# Patient Record
Sex: Male | Born: 1975 | Race: White | Hispanic: No | Smoking: Never smoker
Health system: Southern US, Community
[De-identification: ages and names within clinical notes are randomized; demographics above are authoritative.]

## PROBLEM LIST (undated history)

## (undated) DIAGNOSIS — E559 Vitamin D deficiency, unspecified: Secondary | ICD-10-CM

## (undated) DIAGNOSIS — Z973 Presence of spectacles and contact lenses: Secondary | ICD-10-CM

## (undated) HISTORY — DX: Vitamin D deficiency, unspecified: E55.9

## (undated) HISTORY — DX: Presence of spectacles and contact lenses: Z97.3

## (undated) HISTORY — PX: WISDOM TOOTH EXTRACTION: SHX21

---

## 2011-09-02 ENCOUNTER — Encounter (HOSPITAL_COMMUNITY): Payer: Self-pay | Admitting: *Deleted

## 2011-09-02 ENCOUNTER — Emergency Department (HOSPITAL_COMMUNITY)
Admission: EM | Admit: 2011-09-02 | Discharge: 2011-09-03 | Disposition: A | Discharge status: Home / Self Care | Payer: BC Managed Care – PPO | Attending: Emergency Medicine | Admitting: Emergency Medicine

## 2011-09-02 ENCOUNTER — Emergency Department (HOSPITAL_COMMUNITY): Payer: BC Managed Care – PPO

## 2011-09-02 DIAGNOSIS — K5900 Constipation, unspecified: Secondary | ICD-10-CM | POA: Insufficient documentation

## 2011-09-02 DIAGNOSIS — R10817 Generalized abdominal tenderness: Secondary | ICD-10-CM | POA: Insufficient documentation

## 2011-09-02 DIAGNOSIS — R109 Unspecified abdominal pain: Secondary | ICD-10-CM | POA: Insufficient documentation

## 2011-09-02 DIAGNOSIS — R110 Nausea: Secondary | ICD-10-CM | POA: Insufficient documentation

## 2011-09-02 DIAGNOSIS — R231 Pallor: Secondary | ICD-10-CM | POA: Insufficient documentation

## 2011-09-02 LAB — COMPREHENSIVE METABOLIC PANEL
ALT: 35 U/L (ref 0–53)
AST: 39 U/L — ABNORMAL HIGH (ref 0–37)
Alkaline Phosphatase: 121 U/L — ABNORMAL HIGH (ref 39–117)
CO2: 26 mEq/L (ref 19–32)
Chloride: 96 mEq/L (ref 96–112)
GFR calc non Af Amer: 87 mL/min — ABNORMAL LOW (ref >90)
Potassium: 3.7 mEq/L (ref 3.5–5.1)
Sodium: 134 mEq/L — ABNORMAL LOW (ref 135–145)
Total Bilirubin: 0.4 mg/dL (ref 0.3–1.2)

## 2011-09-02 LAB — URINALYSIS, ROUTINE W REFLEX MICROSCOPIC
Glucose, UA: NEGATIVE mg/dL
Hgb urine dipstick: NEGATIVE
Ketones, ur: NEGATIVE mg/dL
Protein, ur: NEGATIVE mg/dL

## 2011-09-02 LAB — CBC WITH DIFFERENTIAL/PLATELET
Basophils Absolute: 0 10*3/uL (ref 0.0–0.1)
Lymphocytes Relative: 28 % (ref 12–46)
Neutro Abs: 3.5 10*3/uL (ref 1.7–7.7)
Platelets: 188 10*3/uL (ref 150–400)
RBC: 4.78 MIL/uL (ref 4.22–5.81)
RDW: 11.9 % (ref 11.5–15.5)
WBC: 5.9 10*3/uL (ref 4.0–10.5)

## 2011-09-02 MED ORDER — PANTOPRAZOLE SODIUM 40 MG IV SOLR
40.0000 mg | Freq: Once | INTRAVENOUS | Status: AC
  Administered 2011-09-02: 40 mg via INTRAVENOUS
  Filled 2011-09-02: qty 40

## 2011-09-02 MED ORDER — ONDANSETRON HCL 4 MG/2ML IJ SOLN
4.0000 mg | Freq: Once | INTRAMUSCULAR | Status: AC
  Administered 2011-09-02: 4 mg via INTRAVENOUS
  Filled 2011-09-02: qty 2

## 2011-09-02 MED ORDER — IOHEXOL 300 MG/ML  SOLN
100.0000 mL | Freq: Once | INTRAMUSCULAR | Status: AC | PRN
  Administered 2011-09-02: 100 mL via INTRAVENOUS

## 2011-09-02 MED ORDER — SODIUM CHLORIDE 0.9 % IV SOLN
Freq: Once | INTRAVENOUS | Status: AC
  Administered 2011-09-02: 22:00:00 via INTRAVENOUS

## 2011-09-02 NOTE — ED Notes (Signed)
Patient transported to CT 

## 2011-09-02 NOTE — ED Notes (Signed)
Pt. Ambulated more than 30 ft to the bathroom 

## 2011-09-02 NOTE — ED Notes (Signed)
[id number]> Expected date:<BR> Expected time:<BR> Means of arrival:<BR> Comments:<BR> Terminal Clean

## 2011-09-02 NOTE — ED Notes (Signed)
Pt c/o of abd pain x 4 days; was taking tums with minimal relief.  Better this morning.  Pt went to gym prior to ER arrival; felt dizzy and vomiting; had bowel movement x 2 at gym.  Pain worse now.  Mainly lower abd.  Nauseated now.

## 2011-09-02 NOTE — ED Provider Notes (Signed)
History     CSN: [id number]  Arrival date & time 09/02/11  2010   First MD Initiated Contact with Patient 09/02/11 2126      Chief Complaint  Patient presents with  . Abdominal Pain    (Consider location/radiation/quality/duration/timing/severity/associated sxs/prior treatment) HPI Comments: Patient has 4 days of upper bilateral abdominal pain for which he has been taking TUMS with some relief This morning felt better, had a normal BM than this afternoon developed lower abdominal pain and increased nausea without vomiting  States since he has been taking TUMS his stools are darker than normal.  He gets the most relief form the pain with an ice pack applied to his abdomen At this time he is refusing IV pain control choosing instead an ice pack   Patient is a 36 y.o. male presenting with abdominal pain. The history is provided by the patient.  Abdominal Pain The primary symptoms of the illness include abdominal pain and nausea. The primary symptoms of the illness do not include fever, shortness of breath, vomiting, diarrhea or dysuria. The current episode started more than 2 days ago. The onset of the illness was gradual. The problem has been gradually worsening.  The abdominal pain began more than 2 days ago. The pain came on gradually. The abdominal pain has been rapidly worsening since its onset. The abdominal pain is located in the RLQ, suprapubic region and LLQ. The severity of the abdominal pain is 8/10. The abdominal pain is relieved by certain positions. The abdominal pain is exacerbated by certain positions.  Nausea began 3 to 5 days ago. The nausea is exacerbated by activity.  The patient has not had a change in bowel habit. Symptoms associated with the illness do not include chills, constipation or frequency.    History reviewed. No pertinent past medical history.  History reviewed. No pertinent past surgical history.  No family history on file.  History  Substance Use Topics   . Smoking status: Never Smoker   . Smokeless tobacco: Not on file  . Alcohol Use: No      Review of Systems  Constitutional: Negative for fever and chills.  HENT: Negative for sore throat.   Respiratory: Negative for shortness of breath.   Cardiovascular: Negative for chest pain.  Gastrointestinal: Positive for nausea and abdominal pain. Negative for vomiting, diarrhea, constipation and rectal pain.  Genitourinary: Negative for dysuria and frequency.  Neurological: Negative for dizziness and weakness.    Allergies  Review of patient's allergies indicates no known allergies.  Home Medications   Current Outpatient Rx  Name Route Sig Dispense Refill  . CALCIUM CARBONATE ANTACID 500 MG PO CHEW Oral Chew 2 tablets by mouth every 6 (six) hours as needed. For stomach pain.    Marland Kitchen OMEGA-3 FATTY ACIDS 1000 MG PO CAPS Oral Take 2 g by mouth daily.    . ADULT MULTIVITAMIN W/MINERALS CH Oral Take 1 tablet by mouth daily.    Marland Kitchen LANSOPRAZOLE 30 MG PO CPDR Oral Take 1 capsule (30 mg total) by mouth daily. 30 capsule 1    BP 134/93  Pulse 51  Temp 98.4 F (36.9 C) (Oral)  Resp 18  SpO2 97%  Physical Exam  Constitutional: He appears well-developed and well-nourished.  HENT:  Head: Normocephalic.  Eyes: Pupils are equal, round, and reactive to light.  Neck: Normal range of motion.  Cardiovascular: Normal rate and regular rhythm.   Pulmonary/Chest: Effort normal.  Abdominal: Normal appearance. He exhibits no distension. Bowel sounds are  decreased. There is generalized tenderness. There is guarding. No hernia. Hernia confirmed negative in the ventral area, confirmed negative in the right inguinal area and confirmed negative in the left inguinal area.  Musculoskeletal: Normal range of motion.  Neurological: He is alert.  Skin: Skin is warm and dry. There is pallor.    ED Course  Procedures (including critical care time)  Labs Reviewed  COMPREHENSIVE METABOLIC PANEL - Abnormal;  Notable for the following:    Sodium 134 (*)     AST 39 (*)     Alkaline Phosphatase 121 (*)     GFR calc non Af Amer 87 (*)     All other components within normal limits  CBC WITH DIFFERENTIAL  LIPASE, BLOOD  URINALYSIS, ROUTINE W REFLEX MICROSCOPIC  LAB REPORT - SCANNED   No results found.   1. Abdominal pain   2. Constipation       MDM  Constipation on CT Scan but Appendix is not visualized Patient will be give a laxative, return in 24 hours for recheck         Arman Filter, NP 09/09/11 1950

## 2011-09-03 MED ORDER — POLYETHYLENE GLYCOL 3350 17 GM/SCOOP PO POWD: 17.0000 g | Freq: Every day | ORAL | Status: AC

## 2011-09-03 MED ORDER — LACTULOSE 10 GM/15ML PO SOLN
20.0000 g | Freq: Once | ORAL | Status: AC
  Administered 2011-09-03: 20 g via ORAL
  Filled 2011-09-03: qty 30

## 2011-09-03 NOTE — ED Notes (Addendum)
Pt reports having abdominal pain that started in the upper quadrants for 4 days, but is now in the lower quadrants. Pt reports having dark, but non-loose stool. Pt denies vomiting, but reports being nauseous this morning.

## 2011-09-06 ENCOUNTER — Ambulatory Visit (INDEPENDENT_AMBULATORY_CARE_PROVIDER_SITE_OTHER): Payer: BC Managed Care – PPO | Admitting: Emergency Medicine

## 2011-09-06 VITALS — BP 146/81 | HR 71 | Temp 98.7°F | Resp 18 | Ht 66.5 in | Wt 140.6 lb

## 2011-09-06 DIAGNOSIS — R109 Unspecified abdominal pain: Secondary | ICD-10-CM

## 2011-09-06 LAB — POCT CBC
MCH, POC: 30.5 pg (ref 27–31.2)
MID (cbc): 0.6 (ref 0–0.9)
MPV: 13.3 fL (ref 0–99.8)
POC MID %: 7.9 %M (ref 0–12)
Platelet Count, POC: 219 10*3/uL (ref 142–424)
RBC: 5.48 M/uL (ref 4.69–6.13)
WBC: 7.5 10*3/uL (ref 4.6–10.2)

## 2011-09-06 MED ORDER — LANSOPRAZOLE 30 MG PO CPDR: 30.0000 mg | DELAYED_RELEASE_CAPSULE | Freq: Every day | ORAL | Status: DC

## 2011-09-06 NOTE — Progress Notes (Signed)
  Subjective:    Patient ID: Charles Bartlett, male    DOB: 1976-02-20, 36 y.o.   MRN: [id number]  HPI Comments: Says pain develops in daytime and worsens during course of day.  Was seen in ED and had lab workup which was normal and CT abdomen which was normal.  Diagnosed with constipation.  Was on tums and had little improvement while on tums.  Now fasting for Ramadan.  Abdominal Pain This is a recurrent problem. The current episode started 1 to 4 weeks ago. The onset quality is gradual. The problem occurs daily. The problem has been gradually worsening. The pain is located in the epigastric region and periumbilical region. The pain is moderate. The abdominal pain does not radiate. Pertinent negatives include no anorexia, arthralgias, belching, constipation, diarrhea, dysuria, fever, flatus, frequency, headaches, hematochezia, hematuria, melena, myalgias, nausea, vomiting or weight loss. The pain is relieved by nothing. He has tried antacids for the symptoms. Prior diagnostic workup includes CT scan. There is no history of abdominal surgery, colon cancer, Crohn's disease, gallstones, GERD, irritable bowel syndrome, pancreatitis, PUD or ulcerative colitis.      Review of Systems  Constitutional: Negative.  Negative for fever and weight loss.  HENT: Negative.   Eyes: Negative.   Respiratory: Negative.   Cardiovascular: Negative.   Gastrointestinal: Positive for abdominal pain. Negative for nausea, vomiting, diarrhea, constipation, melena, hematochezia, anorexia and flatus.  Genitourinary: Negative for dysuria, frequency and hematuria.  Musculoskeletal: Negative for myalgias and arthralgias.  Skin: Negative.   Neurological: Negative for headaches.       Objective:   Physical Exam  Constitutional: He is oriented to person, place, and time. He appears well-developed and well-nourished.  HENT:  Head: Normocephalic and atraumatic.  Right Ear: External ear normal.  Left Ear: External ear normal.   Mouth/Throat: Oropharynx is clear and moist.  Eyes: Conjunctivae are normal. Pupils are equal, round, and reactive to light. No scleral icterus.  Neck: Normal range of motion. Neck supple.  Cardiovascular: Normal rate.   Pulmonary/Chest: Effort normal and breath sounds normal.  Abdominal: Soft. Bowel sounds are normal. He exhibits no distension and no mass. There is generalized tenderness. There is no rebound and no guarding.  Musculoskeletal: Normal range of motion.  Neurological: He is alert and oriented to person, place, and time.  Skin: Skin is warm and dry.          Assessment & Plan:  Abdominal pain Refer to GI Start on prevacid   Results for orders placed in visit on 09/06/11  POCT CBC      Component Value Range   WBC 7.5  4.6 - 10.2 K/uL   Lymph, poc 2.2  0.6 - 3.4   POC LYMPH PERCENT 28.9  10 - 50 %L   MID (cbc) 0.6  0 - 0.9   POC MID % 7.9  0 - 12 %M   POC Granulocyte 4.7  2 - 6.9   Granulocyte percent 63.2  37 - 80 %G   RBC 5.48  4.69 - 6.13 M/uL   Hemoglobin 16.7  14.1 - 18.1 g/dL   HCT, POC 40.9  81.1 - 53.7 %   MCV 95.0  80 - 97 fL   MCH, POC 30.5  27 - 31.2 pg   MCHC 32.1  31.8 - 35.4 g/dL   RDW, POC 91.4     Platelet Count, POC 219  142 - 424 K/uL   MPV 13.3  0 - 99.8 fL

## 2011-09-10 NOTE — ED Provider Notes (Signed)
Medical screening examination/treatment/procedure(s) were performed by non-physician practitioner and as supervising physician I was immediately available for consultation/collaboration.   Glynn Octave, MD 09/10/11 1243

## 2011-10-29 ENCOUNTER — Encounter: Payer: BC Managed Care – PPO | Admitting: Physician Assistant

## 2012-09-11 ENCOUNTER — Ambulatory Visit: Payer: Managed Care, Other (non HMO) | Admitting: Emergency Medicine

## 2012-09-11 VITALS — BP 120/80 | HR 86 | Temp 98.0°F | Resp 16 | Ht 66.0 in | Wt 139.0 lb

## 2012-09-11 DIAGNOSIS — Z0000 Encounter for general adult medical examination without abnormal findings: Secondary | ICD-10-CM

## 2012-09-11 LAB — COMPREHENSIVE METABOLIC PANEL
ALT: 21 U/L (ref 0–53)
AST: 20 U/L (ref 0–37)
Alkaline Phosphatase: 77 U/L (ref 39–117)
Chloride: 103 mEq/L (ref 96–112)
Creat: 0.94 mg/dL (ref 0.50–1.35)
Total Bilirubin: 0.5 mg/dL (ref 0.3–1.2)

## 2012-09-11 LAB — LIPID PANEL
LDL Cholesterol: 129 mg/dL — ABNORMAL HIGH (ref 0–99)
Total CHOL/HDL Ratio: 4.2 Ratio
VLDL: 23 mg/dL (ref 0–40)

## 2012-09-11 NOTE — Progress Notes (Signed)
[facility] [address] [place] [address] [phone]  Date:  09/11/2012   Name:  Jabbar Palmero   DOB:  [birth year]   MRN:  [id number]  PCP:  No PCP Per Patient    Chief Complaint: Annual Exam   History of Present Illness:  Dock Baccam is a 37 y.o. very pleasant male patient who presents with the following:  Wellness examination.  Denies other complaint or health concern today.   There are no active problems to display for this patient.   History reviewed. No pertinent past medical history.  History reviewed. No pertinent past surgical history.  History  Substance Use Topics  . Smoking status: Never Smoker   . Smokeless tobacco: Not on file  . Alcohol Use: No    History reviewed. No pertinent family history.  No Known Allergies  Medication list has been reviewed and updated.  Current Outpatient Prescriptions on File Prior to Visit  Medication Sig Dispense Refill  . calcium carbonate (TUMS - DOSED IN MG ELEMENTAL CALCIUM) 500 MG chewable tablet Chew 2 tablets by mouth every 6 (six) hours as needed. For stomach pain.      . fish oil-omega-3 fatty acids 1000 MG capsule Take 2 g by mouth daily.      . Multiple Vitamin (MULTIVITAMIN WITH MINERALS) TABS Take 1 tablet by mouth daily.      . lansoprazole (PREVACID) 30 MG capsule Take 1 capsule (30 mg total) by mouth daily.  30 capsule  1   No current facility-administered medications on file prior to visit.    Review of Systems:  As per HPI, otherwise negative.    Physical Examination: Filed Vitals:   09/11/12 0832  BP: 120/80  Pulse: 86  Temp: 98 F (36.7 C)  Resp: 16   Filed Vitals:   09/11/12 0832  Height: 5\' 6"  (1.676 m)  Weight: 139 lb (63.05 kg)   Body mass index is 22.45 kg/(m^2). Ideal Body Weight: Weight in (lb) to have BMI = 25: 154.6  GEN: WDWN, NAD, Non-toxic, A & O x 3 HEENT: Atraumatic, Normocephalic. Neck supple. No masses, No LAD. Ears and Nose: No external  deformity. CV: RRR, No M/G/R. No JVD. No thrill. No extra heart sounds. PULM: CTA B, no wheezes, crackles, rhonchi. No retractions. No resp. distress. No accessory muscle use. ABD: S, NT, ND, +BS. No rebound. No HSM. EXTR: No c/c/e NEURO Normal gait.  PSYCH: Normally interactive. Conversant. Not depressed or anxious appearing.  Calm demeanor.    Assessment and Plan: Wellness exam Labs   Signed,  Phillips Odor, MD

## 2012-09-12 ENCOUNTER — Encounter: Payer: Self-pay | Admitting: Family Medicine

## 2012-09-12 ENCOUNTER — Encounter: Payer: Self-pay | Admitting: *Deleted

## 2013-02-07 ENCOUNTER — Ambulatory Visit: Payer: Managed Care, Other (non HMO) | Admitting: Physician Assistant

## 2013-02-07 VITALS — BP 126/72 | HR 74 | Temp 98.4°F | Resp 16 | Ht 67.0 in | Wt 145.0 lb

## 2013-02-07 DIAGNOSIS — Z23 Encounter for immunization: Secondary | ICD-10-CM

## 2013-02-07 NOTE — Progress Notes (Signed)
   Subjective:    Patient ID: Charles Bartlett, male    DOB: 10/26/1975, 37 y.o.   MRN: [id number]   PCP: No PCP Per Patient  Chief Complaint  Patient presents with  . Immunizations    meningitis   Medications, allergies, past medical history, surgical history, family history, social history and problem list reviewed and updated.  HPI  Travelling to [place].  Needs meningitis vaccine. Has not received the flu vaccine yet this season, but is willing to this evening.   Review of Systems     Objective:   Physical Exam  BP 126/72  Pulse 74  Temp(Src) 98.4 F (36.9 C) (Oral)  Resp 16  Ht 5\' 7"  (1.702 m)  Wt 145 lb (65.772 kg)  BMI 22.71 kg/m2  SpO2 100% WDWNM, A&O x 3.      Assessment & Plan:  1. Need for meningococcal vaccination Counseled to get the vaccine to prevent the B strain when it is available in this country. - Meningococcal conjugate vaccine 4-valent IM  2. Need for influenza vaccination - Flu Vaccine QUAD 36+ mos IM   Fernande Bras, PA-C Physician Assistant-Certified [facility]

## 2014-07-05 IMAGING — CT CT ABD-PELV W/ CM
1 of 2 series · 15 of 32 positions shown, 19 images · IV contrast (omnipaque)
Comparison: None.

CLINICAL DATA: Abdominal pain

CT ABDOMEN AND PELVIS WITH CONTRAST
TECHNIQUE: Multidetector CT imaging of the abdomen and pelvis was
performed following the standard protocol during bolus
administration of intravenous contrast.
Contrast: 100mL OMNIPAQUE IOHEXOL 300 MG/ML  SOLN

[Series 2: abd/pel with · axial · 0.64mm/px · z∈[-588,-242]mm · 15 of 75 slices shown, 19 images]
[im 3/75  soft-tissue]
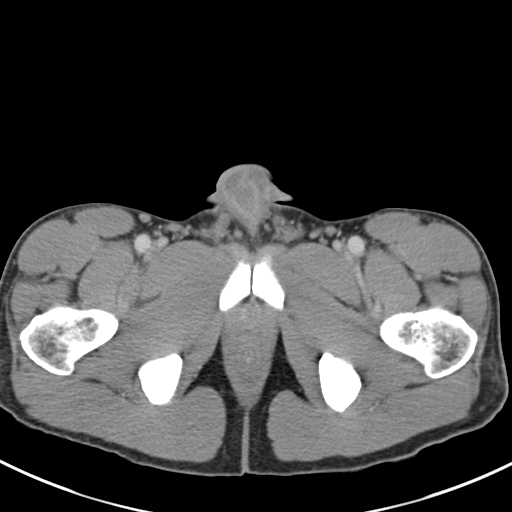
[im 3/75  bone]
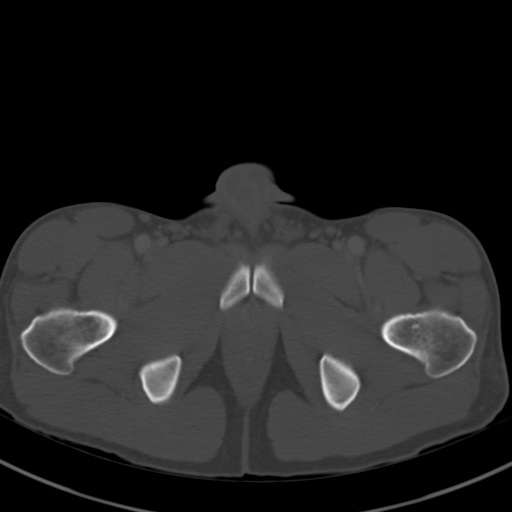
[im 9/75  soft-tissue]
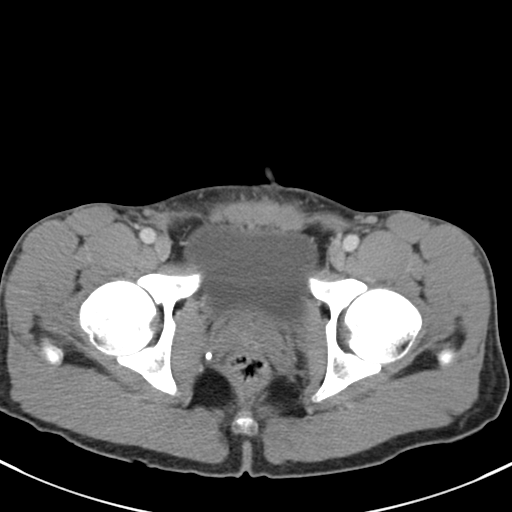
[im 15/75  soft-tissue]
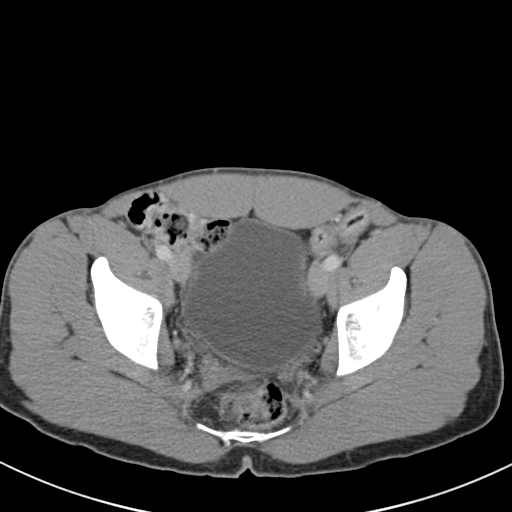
[im 21/75  soft-tissue]
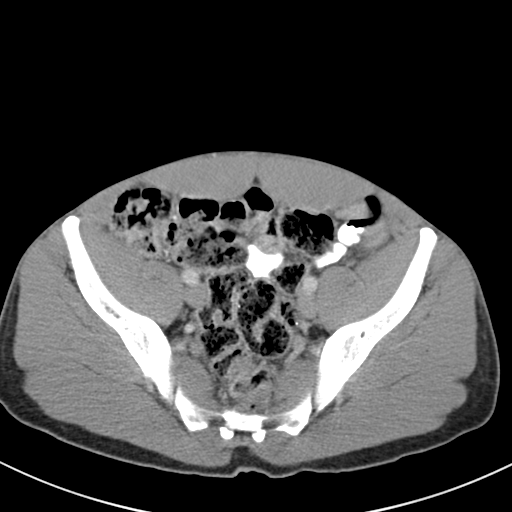
[im 27/75  soft-tissue]
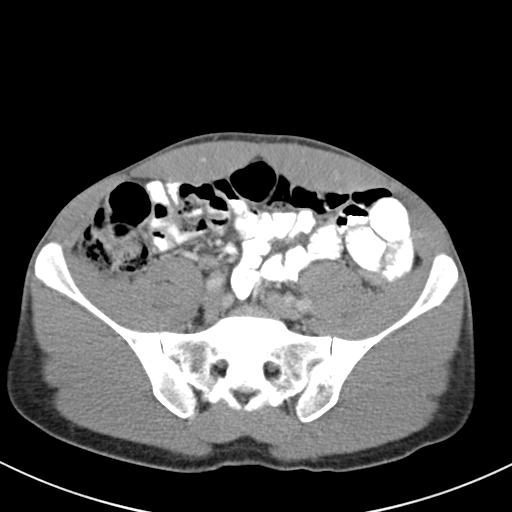
[im 33/75  soft-tissue]
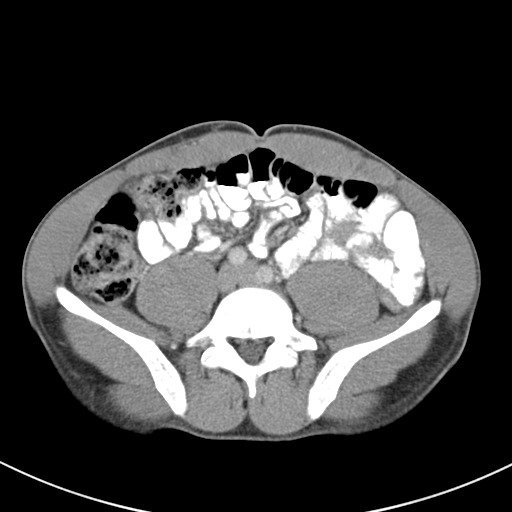
[im 39/75  soft-tissue]
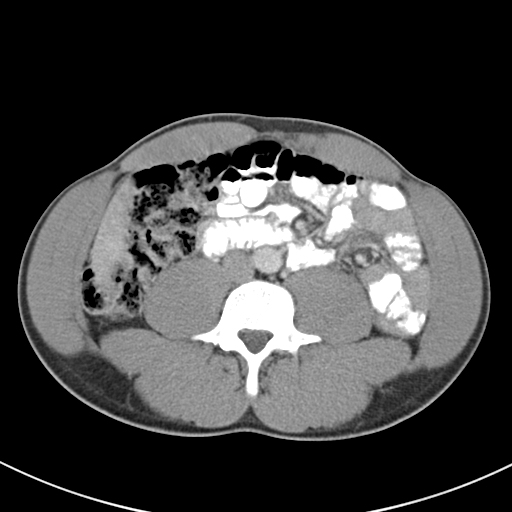
[im 42/75  soft-tissue]
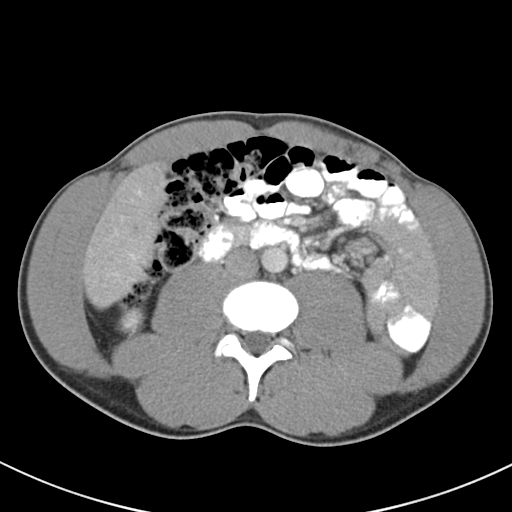
[im 48/75  soft-tissue]
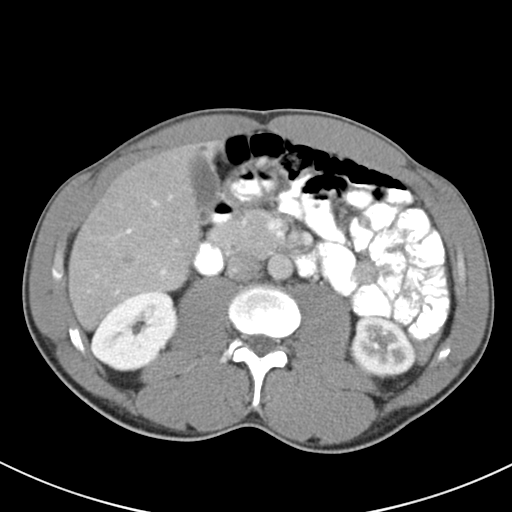
[im 48/75  bone]
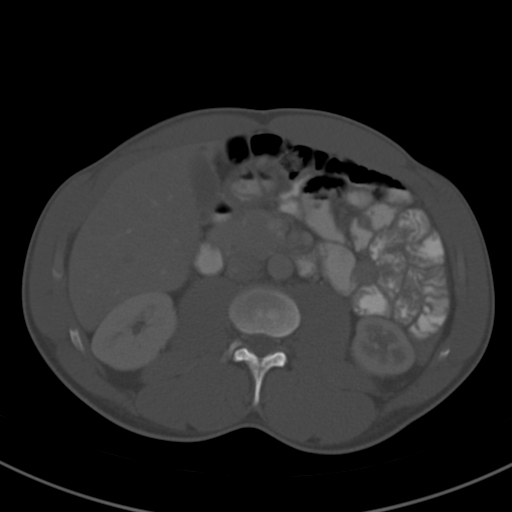
[im 54/75  soft-tissue]
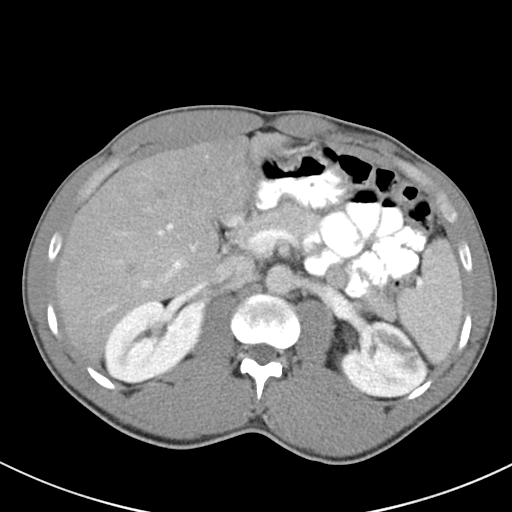
[im 60/75  soft-tissue]
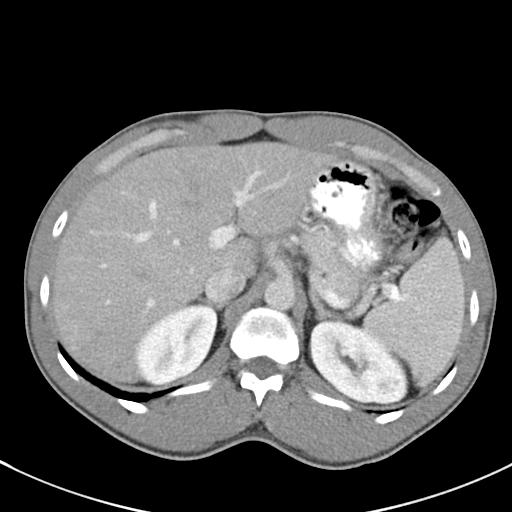
[im 63/75  lung]
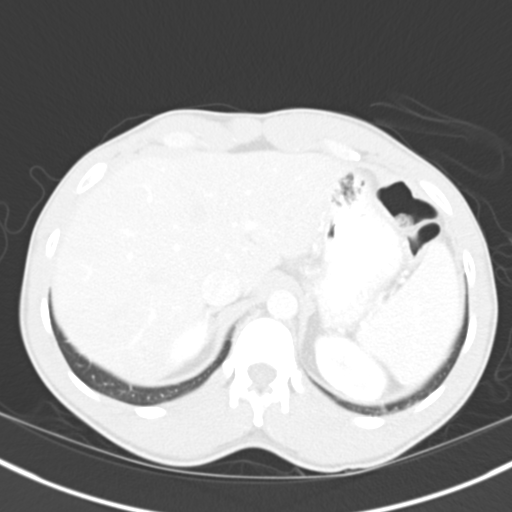
[im 66/75  soft-tissue]
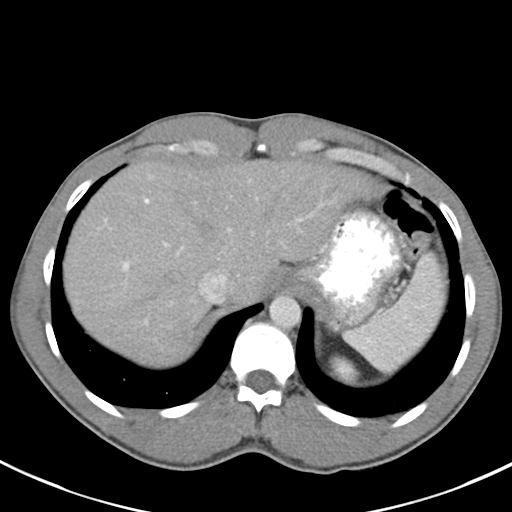
[im 66/75  lung]
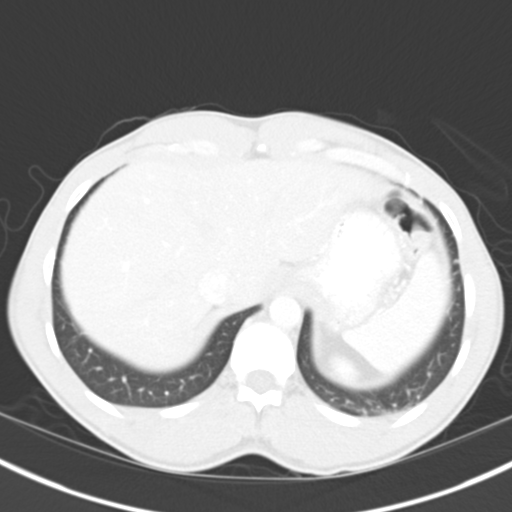
[im 69/75  lung]
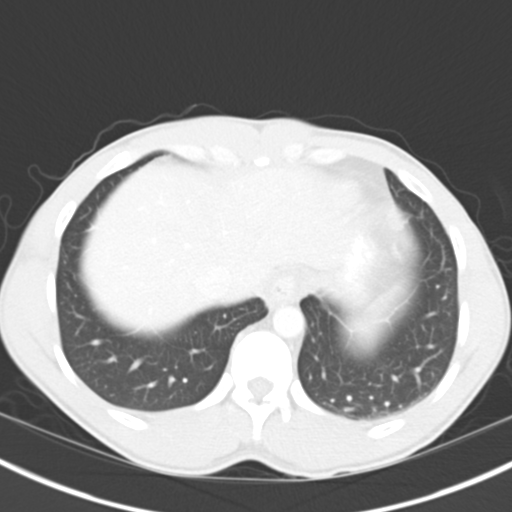
[im 72/75  soft-tissue]
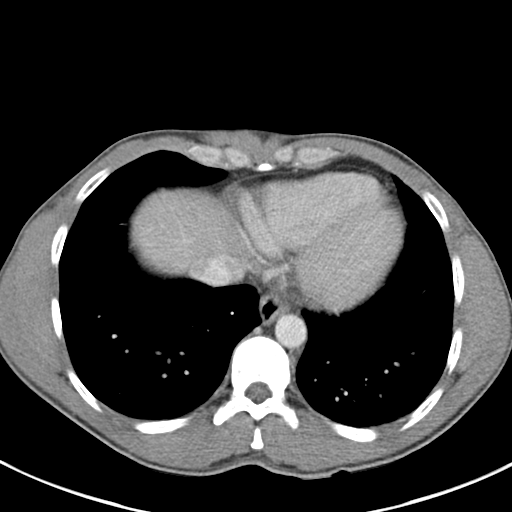
[im 72/75  lung]
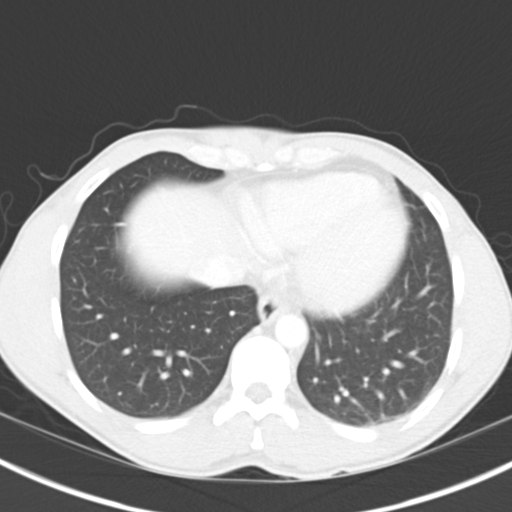

[15 of 32 positions shown; findings below may reference images not displayed]

FINDINGS: Liver, gallbladder, spleen, adrenal glands, right kidney
are within normal limits.  Irregular hypodensities in the left
kidney are noted.

Bladder and prostate are unremarkable.  No free fluid.

Appendix is not visualized.

Retroaortic left renal vein anatomy.

Prominent stool in the ascending and transverse colon.  Prominent
stool in the sigmoid.
IMPRESSION: No acute intra-abdominal pathology.

There is an irregular hypodensity in the left kidney with the other
smaller hypodensities.  This abnormality most likely represents a
minimally complex cysts however it is incompletely characterized on
this study.  There is no finding to suggest injury or perinephric
hematoma.  If there is strong concern for malignancy, MRI can be
performed.  Otherwise, 6-month follow-up is recommended to ensure
stability.

## 2017-04-06 ENCOUNTER — Encounter (HOSPITAL_COMMUNITY): Payer: Self-pay

## 2017-04-06 ENCOUNTER — Emergency Department (HOSPITAL_COMMUNITY)
Admission: EM | Admit: 2017-04-06 | Discharge: 2017-04-06 | Disposition: A | Discharge status: Home / Self Care | Payer: Managed Care, Other (non HMO) | Attending: Emergency Medicine | Admitting: Emergency Medicine

## 2017-04-06 ENCOUNTER — Emergency Department (HOSPITAL_COMMUNITY): Payer: Managed Care, Other (non HMO)

## 2017-04-06 DIAGNOSIS — R0789 Other chest pain: Secondary | ICD-10-CM | POA: Insufficient documentation

## 2017-04-06 DIAGNOSIS — Z79899 Other long term (current) drug therapy: Secondary | ICD-10-CM | POA: Diagnosis not present

## 2017-04-06 DIAGNOSIS — R072 Precordial pain: Secondary | ICD-10-CM | POA: Diagnosis not present

## 2017-04-06 DIAGNOSIS — R079 Chest pain, unspecified: Secondary | ICD-10-CM | POA: Diagnosis present

## 2017-04-06 LAB — CBC
HCT: 46.1 % (ref 39.0–52.0)
Hemoglobin: 16.3 g/dL (ref 13.0–17.0)
MCH: 31.6 pg (ref 26.0–34.0)
MCHC: 35.4 g/dL (ref 30.0–36.0)
MCV: 89.3 fL (ref 78.0–100.0)
Platelets: 213 10*3/uL (ref 150–400)
RBC: 5.16 MIL/uL (ref 4.22–5.81)
RDW: 12.4 % (ref 11.5–15.5)
WBC: 4.8 10*3/uL (ref 4.0–10.5)

## 2017-04-06 LAB — BASIC METABOLIC PANEL
Anion gap: 11 (ref 5–15)
BUN: 19 mg/dL (ref 6–20)
CALCIUM: 9.2 mg/dL (ref 8.9–10.3)
CO2: 24 mmol/L (ref 22–32)
Chloride: 103 mmol/L (ref 101–111)
Creatinine, Ser: 1.14 mg/dL (ref 0.61–1.24)
GFR calc Af Amer: >60 mL/min (ref >60)
GLUCOSE: 95 mg/dL (ref 65–99)
Potassium: 4.2 mmol/L (ref 3.5–5.1)
Sodium: 138 mmol/L (ref 135–145)

## 2017-04-06 LAB — I-STAT TROPONIN, ED: TROPONIN I, POC: 0 ng/mL (ref 0.00–0.08)

## 2017-04-06 NOTE — ED Triage Notes (Addendum)
Per [facility], Pt is coming from [facility] with complaints of chest pain that started four days ago. Denies SOB, N/V. Vitals per EMS; 118/77, 74 HR, 100% on RA.   324 mg of ASA given at [facility]. [facility]

## 2017-04-06 NOTE — Discharge Instructions (Signed)
It was our pleasure to provide your ER care today - we hope that you feel better.  Your tests today in the ER look good.  Follow up with primary care doctor in the coming week.   Return to ER if worse, new symptoms, fevers, trouble breathing, recurrent/persistent chest pain, other concern.

## 2017-04-06 NOTE — ED Notes (Signed)
Pt given water, ok per MD Steinl.

## 2017-04-06 NOTE — ED Provider Notes (Signed)
[facility]   CSN: [id number] Arrival date & time: 04/06/17  1306     History   Chief Complaint Chief Complaint  Patient presents with  . Chest Pain    HPI Charles Perchshraf Bartlett is a 42 y.o. male.  Patient c/o mid to left chest pain at rest in past 2-3 days. Pain only occurs at rest, not w exertion. Dull, mild, non radiating. No associated sob, nv or diaphoresis. Not pleuritic. No unusual doe. Denies chest wall injury or strain. No heartburn or hx gerd. Denies leg pain or swelling. No hx dvt or pe. No constant and/or pleuritic pain. No fevevr or chills. No cough or uri symptoms.    The history is provided by the patient.  Chest Pain   Pertinent negatives include no abdominal pain, no back pain, no cough, no fever, no headaches and no shortness of breath.    History reviewed. No pertinent past medical history.  There are no active problems to display for this patient.   Past Surgical History:  Procedure Laterality Date  . WISDOM TOOTH EXTRACTION         Home Medications    Prior to Admission medications   Medication Sig Start Date End Date Taking? Authorizing Provider  Multiple Vitamin (MULTIVITAMIN WITH MINERALS) TABS Take 1 tablet by mouth daily.    [provider]    Family History No family history on file.  Social History Social History   Tobacco Use  . Smoking status: Never Smoker  . Smokeless tobacco: Never Used  Substance Use Topics  . Alcohol use: No  . Drug use: No     Allergies   Gelatin   Review of Systems Review of Systems  Constitutional: Negative for fever.  HENT: Negative for sore throat.   Eyes: Negative for redness.  Respiratory: Negative for cough and shortness of breath.   Cardiovascular: Positive for chest pain.  Gastrointestinal: Negative for abdominal pain.  Genitourinary: Negative for flank pain.  Musculoskeletal: Negative for back pain.  Skin: Negative for rash.    Neurological: Negative for headaches.  Hematological: Does not bruise/bleed easily.  Psychiatric/Behavioral: Negative for confusion.     Physical Exam Updated Vital Signs BP 132/63 (BP Location: Right Arm)   Pulse 80   Temp 98.2 F (36.8 C) (Oral)   Resp 16   Ht 1.702 m (5\' 7" )   Wt 70.3 kg (155 lb)   SpO2 98%   BMI 24.28 kg/m   Physical Exam  Constitutional: He appears well-developed and well-nourished. No distress.  HENT:  Mouth/Throat: Oropharynx is clear and moist.  Eyes: Conjunctivae are normal.  Neck: Neck supple. No tracheal deviation present.  Cardiovascular: Normal rate, regular rhythm and intact distal pulses. Exam reveals no gallop and no friction rub.  No murmur heard. Pulmonary/Chest: Effort normal and breath sounds normal. No accessory muscle usage. No respiratory distress. He exhibits no tenderness.  Abdominal: He exhibits no distension. There is no tenderness.  Musculoskeletal: He exhibits no edema or tenderness.  Neurological: He is alert.  Skin: Skin is warm and dry. No rash noted. He is not diaphoretic.  Psychiatric: He has a normal mood and affect.  Nursing note and vitals reviewed.    ED Treatments / Results  Labs (all labs ordered are listed, but only abnormal results are displayed) Results for orders placed or performed during the hospital encounter of 04/06/17  Basic metabolic panel  Result Value Ref Range   Sodium 138 135 -  145 mmol/L   Potassium 4.2 3.5 - 5.1 mmol/L   Chloride 103 101 - 111 mmol/L   CO2 24 22 - 32 mmol/L   Glucose, Bld 95 65 - 99 mg/dL   BUN 19 6 - 20 mg/dL   Creatinine, Ser 1.61 0.61 - 1.24 mg/dL   Calcium 9.2 8.9 - 09.6 mg/dL   GFR calc non Af Amer >60 >60 mL/min   GFR calc Af Amer >60 >60 mL/min   Anion gap 11 5 - 15  CBC  Result Value Ref Range   WBC 4.8 4.0 - 10.5 K/uL   RBC 5.16 4.22 - 5.81 MIL/uL   Hemoglobin 16.3 13.0 - 17.0 g/dL   HCT 04.5 40.9 - 81.1 %   MCV 89.3 78.0 - 100.0 fL   MCH 31.6 26.0 - 34.0  pg   MCHC 35.4 30.0 - 36.0 g/dL   RDW 91.4 78.2 - 95.6 %   Platelets 213 150 - 400 K/uL  I-stat troponin, ED  Result Value Ref Range   Troponin i, poc 0.00 0.00 - 0.08 ng/mL   Comment 3           Dg Chest 2 View  Result Date: 04/06/2017 CLINICAL DATA:  Left upper chest pain and upper quadrant abdominal pain x2 days EXAM: CHEST  2 VIEW COMPARISON:  None. FINDINGS: Lungs are clear.  No pleural effusion or pneumothorax. The heart is normal in size. Visualized osseous structures are within normal limits. IMPRESSION: Normal chest radiographs. Electronically Signed   By: Charline Bills M.D.   On: 04/06/2017 14:28    EKG  EKG Interpretation  Date/Time:  Monday April 06 2017 13:09:19 EST Ventricular Rate:  84 PR Interval:  132 QRS Duration: 92 QT Interval:  368 QTC Calculation: 434 R Axis:   86 Text Interpretation:  Normal sinus rhythm Normal ECG Confirmed by Cathren Laine ([id number]) on 04/06/2017 5:26:25 PM       Radiology Dg Chest 2 View  Result Date: 04/06/2017 CLINICAL DATA:  Left upper chest pain and upper quadrant abdominal pain x2 days EXAM: CHEST  2 VIEW COMPARISON:  None. FINDINGS: Lungs are clear.  No pleural effusion or pneumothorax. The heart is normal in size. Visualized osseous structures are within normal limits. IMPRESSION: Normal chest radiographs. Electronically Signed   By: Charline Bills M.D.   On: 04/06/2017 14:28    Procedures Procedures (including critical care time)  Medications Ordered in ED Medications - No data to display   Initial Impression / Assessment and Plan / ED Course  I have reviewed the triage vital signs and the nursing notes.  Pertinent labs & imaging results that were available during my care of the patient were reviewed by me and considered in my medical decision making (see chart for details).  After symptoms for past 2 days, trop is neg. Chest xray neg.   Exam unremarkable.  No current chest pain.   Pt currently appears  stable for d/c.    Final Clinical Impressions(s) / ED Diagnoses   Final diagnoses:  None    ED Discharge Orders    None       Cathren Laine, MD 04/06/17 1819

## 2017-04-13 ENCOUNTER — Encounter: Payer: Self-pay | Admitting: Medical

## 2017-04-13 ENCOUNTER — Ambulatory Visit (INDEPENDENT_AMBULATORY_CARE_PROVIDER_SITE_OTHER): Payer: Managed Care, Other (non HMO) | Admitting: Medical

## 2017-04-13 VITALS — BP 134/76 | HR 78 | Ht 66.0 in | Wt 153.8 lb

## 2017-04-13 DIAGNOSIS — R079 Chest pain, unspecified: Secondary | ICD-10-CM | POA: Diagnosis not present

## 2017-04-13 MED ORDER — OMEPRAZOLE 40 MG PO CPDR: 40.0000 mg | DELAYED_RELEASE_CAPSULE | Freq: Every day | ORAL | 0 refills | Status: DC

## 2017-04-13 NOTE — Progress Notes (Signed)
Subjective:    Charles Bartlett is a 42 y.o. male who presents for evaluation of chest pain, emergency department follow-up.  New patient today.  About a week ago had chest pain, uncomfortable chest pain for a few days not better or worse.  A week ago went to Urgent Care, had EKG that was abnormal, sent to ED by EMS.  [facility].  Was told that chest pain was non cardiac.  still has the chest pain.  He reports the chest pain as being somewhat central in his chest, last for minutes, without associated shortness of breath or nausea or sweats.  No arm pain no numbness or tingling.  No prior similar chest pain.  No history of acid reflux.  Family history of heart disease.  He is a non-smoker.  No alcohol.  No other aggravating or relieving factors. No other complaint.  Past Medical History:  Diagnosis Date  . Wears contact lenses    Current Outpatient Medications on File Prior to Visit  Medication Sig Dispense Refill  . Multiple Vitamin (MULTIVITAMIN WITH MINERALS) TABS Take 1 tablet by mouth daily.     No current facility-administered medications on file prior to visit.     Review of Systems Constitutional: denies fever, chills, sweats, unexpected weight change, anorexia, fatigue ENT: no runny nose, ear pain, sore throat, hoarseness, sinus pain, hearing loss, epistaxis Cardiology: denies palpitations, edema, orthopnea, paroxysmal nocturnal dyspnea Respiratory: denies cough, shortness of breath, dyspnea on exertion, wheezing, hemoptysis Gastroenterology: denies abdominal pain, nausea, vomiting, diarrhea, constipation,  Hematology: denies bleeding or bruising problems Musculoskeletal: denies arthralgias, myalgias, joint swelling, back pain, neck pain, cramping, gait changes Ophthalmology: denies vision changes Urology: denies dysuria, difficulty urinating, hematuria, urinary frequency, urgency, incontinence Neurology: no headache, weakness, tingling, numbness, speech  abnormality, memory loss, falls, dizziness Psychology: denies depressed mood, agitation, sleep problems    Objective:   BP 134/76   Pulse 78   Ht 5\' 6"  (1.676 m)   Wt 153 lb 12.8 oz (69.8 kg)   SpO2 97%   BMI 24.82 kg/m   General appearance: alert, no distress, WD/WN, male  HEENT: normocephalic, conjunctiva/corneas normal, sclerae anicteric, PERRLA, EOMi, nares patent, no discharge or erythema, pharynx normal Oral cavity: MMM, tongue normal, teeth normal Neck: supple, no lymphadenopathy, no thyromegaly, no JVD, no bruits, no masses, normal ROM Chest: Mild muscular tenderness of the pectoralis muscles, otherwise non tender, normal shape and expansion Heart: RRR, normal S1, S2, no murmurs Lungs: CTA bilaterally, no wheezes, rhonchi, or rales Abdomen: +bs, soft, mild epigastric tenderness, otherwise non tender, non distended, no masses, no hepatomegaly, no splenomegaly, no bruits Back: non tender, normal ROM, no scoliosis Extremities: no edema, no cyanosis, no clubbing Pulses: 2+ symmetric, upper and lower extremities, normal cap refill Neurological: alert, oriented x 3, CN2-12 intact, strength normal upper extremities and lower extremities, sensation normal throughout, DTRs 2+ throughout, no cerebellar signs, gait normal Psychiatric: normal affect, behavior normal, pleasant     Assessment:   Encounter Diagnosis  Name Primary?  . Chest pain, unspecified type Yes     Plan:   Reviewed his recent emergency department visit notes, EKG, chest x-ray, labs and troponin.  We discussed possible causes of chest pain including cardiac causes, GERD, anxiety, musculoskeletal chest pain, pulmonary embolism, asthma or other.  His symptoms are atypical, are nonspecific to any certain thing, and he is low risk based on health history of self and family.  He denies history of GERD, denies lots  of stressors.  I will have him use short-term omeprazole in the event of acid reflux, avoid GERD  triggers and avoid eating late at night.  Advised follow-up in 1-2 weeks, however, return sooner if worsening symptoms.  Evie was seen today for new patient (initial visit).  Diagnoses and all orders for this visit:  Chest pain, unspecified type  Other orders -     omeprazole (PRILOSEC) 40 MG capsule; Take 1 capsule (40 mg total) by mouth daily.

## 2017-04-23 ENCOUNTER — Encounter: Payer: Self-pay | Admitting: Medical

## 2017-04-24 ENCOUNTER — Telehealth: Payer: Self-pay

## 2017-04-24 DIAGNOSIS — Z9289 Personal history of other medical treatment: Secondary | ICD-10-CM

## 2017-04-24 HISTORY — DX: Personal history of other medical treatment: Z92.89

## 2017-04-24 NOTE — Telephone Encounter (Signed)
Pt is going to cardiology today and seeing Dr. Lowell GuitarPowell. Pt was advised of address and their office was advised that his ekg and xray are on epic to be viewed. Thanks [employer]

## 2017-04-24 NOTE — Telephone Encounter (Signed)
Called pt but no answer . Sent message urgent so hopefully pt will call back soon. Thanks [employer]

## 2017-04-24 NOTE — Telephone Encounter (Signed)
Pt was seen two weeks ago and now pt is not any better and he is still taking med that were prescribed . Pt says he is experiencing sob and his other symptoms are back .pt says he is going to the ER.  Thanks [employer]

## 2017-04-24 NOTE — Telephone Encounter (Signed)
I unfortuantely didn't see this email message in time, but his symtpoms weren't clear at his visit.  The only odd part was questionable EKG.   So he can go to ED if having worse chest pain, but if he hasn't went yet, lets call and see if cardiology can work him in today.

## 2017-04-30 ENCOUNTER — Ambulatory Visit: Payer: Self-pay

## 2017-04-30 ENCOUNTER — Other Ambulatory Visit: Payer: Self-pay | Admitting: Occupational Medicine

## 2017-04-30 DIAGNOSIS — Z0000 Encounter for general adult medical examination without abnormal findings: Secondary | ICD-10-CM

## 2017-05-11 ENCOUNTER — Encounter: Payer: Self-pay | Admitting: Medical

## 2017-05-25 ENCOUNTER — Encounter: Payer: Managed Care, Other (non HMO) | Admitting: Medical

## 2017-07-25 HISTORY — PX: NO PAST SURGERIES: SHX2092

## 2017-08-17 ENCOUNTER — Ambulatory Visit (INDEPENDENT_AMBULATORY_CARE_PROVIDER_SITE_OTHER): Payer: 59 | Admitting: Medical

## 2017-08-17 ENCOUNTER — Encounter: Payer: Self-pay | Admitting: Medical

## 2017-08-17 VITALS — BP 110/76 | HR 72 | Resp 16 | Ht 68.0 in | Wt 146.2 lb

## 2017-08-17 DIAGNOSIS — Z139 Encounter for screening, unspecified: Secondary | ICD-10-CM | POA: Diagnosis not present

## 2017-08-17 DIAGNOSIS — Z23 Encounter for immunization: Secondary | ICD-10-CM | POA: Diagnosis not present

## 2017-08-17 DIAGNOSIS — Z0000 Encounter for general adult medical examination without abnormal findings: Secondary | ICD-10-CM

## 2017-08-17 DIAGNOSIS — Z7189 Other specified counseling: Secondary | ICD-10-CM

## 2017-08-17 DIAGNOSIS — Z7184 Encounter for health counseling related to travel: Secondary | ICD-10-CM

## 2017-08-17 LAB — POCT URINALYSIS DIP (PROADVANTAGE DEVICE)
Bilirubin, UA: NEGATIVE
Glucose, UA: NEGATIVE mg/dL
Ketones, POC UA: NEGATIVE mg/dL
Leukocytes, UA: NEGATIVE
NITRITE UA: NEGATIVE
PH UA: 6 (ref 5.0–8.0)
Protein Ur, POC: NEGATIVE mg/dL
RBC UA: NEGATIVE
SPECIFIC GRAVITY, URINE: 1.02
UUROB: 3.5

## 2017-08-17 MED ORDER — ACETAZOLAMIDE 125 MG PO TABS: ORAL_TABLET | ORAL | 0 refills | Status: DC

## 2017-08-17 MED ORDER — ATOVAQUONE-PROGUANIL HCL 250-100 MG PO TABS: ORAL_TABLET | ORAL | 0 refills | Status: DC

## 2017-08-17 MED ORDER — CIPROFLOXACIN HCL 500 MG PO TABS: ORAL_TABLET | ORAL | 0 refills | Status: DC

## 2017-08-17 NOTE — Progress Notes (Signed)
Subjective:   HPI  Charles Bartlett is a 42 y.o. male who presents for Chief Complaint  Patient presents with  . Annual Exam    fasting CPE   eye exam last week     MDaune Perchedical care team includes: Tysinger, Kermit Baloavid S, PA-C here for primary care Dentist Eye doctor  Concerns: Doing well.  Likes to do long or interesting hikes.  [place] and other high elevation hikes.  Traveling to [place] travel soon.  Will be there a few weeks.  Has questions about vaccines for travel.    Last tetanus booster unknown.    Wants to use Diamox for altitude sickness prophylaxis.  He had problems on the [place] trip.  Reviewed their medical, surgical, family, social, medication, and allergy history and updated chart as appropriate.  Past Medical History:  Diagnosis Date  . Wears contact lenses     Past Surgical History:  Procedure Laterality Date  . NO PAST SURGERIES  07/2017  . WISDOM TOOTH EXTRACTION      Social History   Socioeconomic History  . Marital status: Divorced    Spouse name: Aura  . Number of children: 2  . Years of education: Master's  . Highest education level: Not on file  Occupational History  . Occupation: Art gallery managerengineer  Social Needs  . Financial resource strain: Not on file  . Food insecurity:    Worry: Not on file    Inability: Not on file  . Transportation needs:    Medical: Not on file    Non-medical: Not on file  Tobacco Use  . Smoking status: Never Smoker  . Smokeless tobacco: Never Used  Substance and Sexual Activity  . Alcohol use: No  . Drug use: No  . Sexual activity: Not on file  Lifestyle  . Physical activity:    Days per week: Not on file    Minutes per session: Not on file  . Stress: Not on file  Relationships  . Social connections:    Talks on phone: Not on file    Gets together: Not on file    Attends religious service: Not on file    Active member of club or organization: Not on file    Attends meetings of clubs or  organizations: Not on file    Relationship status: Not on file  . Intimate partner violence:    Fear of current or ex partner: Not on file    Emotionally abused: Not on file    Physically abused: Not on file    Forced sexual activity: Not on file  Other Topics Concern  . Not on file  Social History Narrative   Lives with his wife, their two children and his stepson.  Originally from [place].  [place]ives in [place], [place]  ComptrollerMechanical Engineer.  Exercises 5 days per week.  07/2017    Family History  Problem Relation Age of Onset  . Other Mother        old age     Current Outpatient Medications:  Marland Kitchen.  Multiple Vitamin (MULTIVITAMIN WITH MINERALS) TABS, Take 1 tablet by mouth daily., Disp: , Rfl:   Allergies  Allergen Reactions  . Gelatin      Review of Systems Constitutional: -fever, -chills, -sweats, -unexpected weight change, -decreased appetite, -fatigue Allergy: -sneezing, -itching, -congestion Dermatology: -changing moles, --rash, -lumps ENT: -runny nose, -ear pain, -sore throat, -hoarseness, -sinus pain, -teeth pain, - ringing in ears, -hearing loss, -nosebleeds  Cardiology: -chest pain, -palpitations, -swelling, -difficulty breathing when lying flat, -waking up short of breath Respiratory: -cough, -shortness of breath, -difficulty breathing with exercise or exertion, -wheezing, -coughing up blood Gastroenterology: -abdominal pain, -nausea, -vomiting, -diarrhea, -constipation, -blood in stool, -changes in bowel movement, -difficulty swallowing or eating Hematology: -bleeding, -bruising  Musculoskeletal: -joint aches, -muscle aches, -joint swelling, -back pain, -neck pain, -cramping, -changes in gait Ophthalmology: denies vision changes, eye redness, itching, discharge Urology: -burning with urination, -difficulty urinating, -blood in urine, -urinary frequency, -urgency, -incontinence Neurology: -headache, -weakness, -tingling, -numbness, -memory loss, -falls,  -dizziness Psychology: -depressed mood, -agitation, -sleep problems Male GU: no testicular mass, pain, no lymph nodes swollen, no swelling, no rash.     Objective:  BP 110/76   Pulse 72   Resp 16   Ht 5\' 8"  (1.727 m)   Wt 146 lb 3.2 oz (66.3 kg)   SpO2 94%   BMI 22.23 kg/m   Wt Readings from Last 3 Encounters:  08/17/17 146 lb 3.2 oz (66.3 kg)  04/13/17 153 lb 12.8 oz (69.8 kg)  04/06/17 155 lb (70.3 kg)    General appearance: alert, no distress, WD/WN, Caucasian male Skin: scattered macules , no worrisome lesions HEENT: normocephalic, conjunctiva/corneas normal, sclerae anicteric, PERRLA, EOMi, nares patent, no discharge or erythema, pharynx normal Oral cavity: MMM, tongue normal, teeth normal Neck: supple, no lymphadenopathy, no thyromegaly, no masses, normal ROM, no bruits Chest: non tender, normal shape and expansion Heart: RRR, normal S1, S2, no murmurs Lungs: CTA bilaterally, no wheezes, rhonchi, or rales Abdomen: +bs, soft, non tender, non distended, no masses, no hepatomegaly, no splenomegaly, no bruits Back: non tender, normal ROM, no scoliosis Musculoskeletal: upper extremities non tender, no obvious deformity, normal ROM throughout, lower extremities non tender, no obvious deformity, normal ROM throughout Extremities: no edema, no cyanosis, no clubbing Pulses: 2+ symmetric, upper and lower extremities, normal cap refill Neurological: alert, oriented x 3, CN2-12 intact, strength normal upper extremities and lower extremities, sensation normal throughout, DTRs 2+ throughout, no cerebellar signs, gait normal Psychiatric: normal affect, behavior normal, pleasant  GU: normal male external genitalia,circumcised, nontender, no masses, no hernia, no lymphadenopathy Rectal: deferred   Assessment and Plan :   Encounter Diagnoses  Name Primary?  . Routine general medical examination at a health care facility Yes  . Screening for condition   . Travel advice encounter   .  Need for Tdap vaccination   . Need for hepatitis A vaccination     Physical exam - discussed and counseled on healthy lifestyle, diet, exercise, preventative care, vaccinations, sick and well care, proper use of emergency dept and after hours care, and addressed their concerns.    Health screening: See your eye doctor yearly for routine vision care. See your dentist yearly for routine dental care including hygiene visits twice yearly.  Cancer screening Advised monthly self testicular exam  Colonoscopy:  Age 70  Discussed PSA, prostate exam, and prostate cancer screening risks/benefits.     Vaccinations: Advised yearly influenza vaccine  Counseled on the Tdap (tetanus, diptheria, and acellular pertussis) vaccine.  Vaccine information sheet given. Tdap vaccine given after consent obtained.  Counseled on the Hepatitis A virus vaccine.  Vaccine information sheet given.  Hepatitis A vaccine given after consent obtained.  Patient was advised to return in 6 months for Hep A #2.  Patient Instructions  Recommendations: I sent to your pharmacy 3 medicine to take with you on your trip  Cipro is as needed for traveler's diarrhea.  If you get several loose stools such as watery loose stools while on your trip you can use Cipro twice a day for 3 to 5 days.  I prescribed a little extra in case you have multiple bouts I sent malaria prophylaxis to begin  1 to 2 days before you arrive, and continue this for 7 days after you leave the area  You can begin the Diamox twice daily 24 hours before you ascend, and discontinue 2-3 days after you arrive at peak elevation or start to descend  Use good handwashing, pay attention and hygiene Hydrate frequently before you become thirsty or dry Do not wait to feel thirsty to hydrate Pace yourself with climbing the high elevation, listen to your body for signs to slow down the back of  Regarding vaccines We updated your tetanus, diphtheria, pertussis  vaccine today.  This is good for 10 years  We updated the first hepatitis A vaccine today.  You will be due for your second hep A vaccine in 6 months  We are checking blood titers for other vaccine immunity  Consider getting the typhoid vaccine and Japanese encephalitis vaccine at the health department  If you were to be bitten by a mammal or bat while there medical care immediately unless you decide you the rabies vaccine series before you go  Enjoy your trip and I want to see pics when you return!      Ryen was seen today for annual exam.  Diagnoses and all orders for this visit:  Routine general medical examination at a health care facility -     POCT Urinalysis DIP (Proadvantage Device) -     Measles/Mumps/Rubella Immunity -     Varicella zoster antibody, IgG -     Lipid panel -     Hepatic function panel  Screening for condition -     Measles/Mumps/Rubella Immunity -     Varicella zoster antibody, IgG  Travel advice encounter  Need for Tdap vaccination  Need for hepatitis A vaccination    Follow-up pending labs, yearly for physical

## 2017-08-17 NOTE — Patient Instructions (Addendum)
Recommendations: I sent to your pharmacy 3 medicine to take with you on your trip  Cipro is as needed for traveler's diarrhea.  If you get several loose stools such as watery loose stools while on your trip you can use Cipro twice a day for 3 to 5 days.  I prescribed a little extra in case you have multiple bouts I sent malaria prophylaxis to begin  1 to 2 days before you arrive, and continue this for 7 days after you leave the area  You can begin the Diamox twice daily 24 hours before you ascend, and discontinue 2-3 days after you arrive at peak elevation or start to descend  Use good handwashing, pay attention and hygiene Hydrate frequently before you become thirsty or dry Do not wait to feel thirsty to hydrate Pace yourself with climbing the high elevation, listen to your body for signs to slow down the back of  Regarding vaccines We updated your tetanus, diphtheria, pertussis vaccine today.  This is good for 10 years  We updated the first hepatitis A vaccine today.  You will be due for your second hep A vaccine in 6 months  We are checking blood titers for other vaccine immunity  Consider getting the typhoid vaccine and Japanese encephalitis vaccine at the health department  If you were to be bitten by a mammal or bat while there medical care immediately unless you decide you the rabies vaccine series before you go  Enjoy your trip and I want to see pics when you return!

## 2017-08-18 LAB — LIPID PANEL
Chol/HDL Ratio: 3.4 ratio (ref 0.0–5.0)
Cholesterol, Total: 195 mg/dL (ref 100–199)
HDL: 58 mg/dL (ref >39)
LDL CALC: 122 mg/dL — AB (ref 0–99)
Triglycerides: 75 mg/dL (ref 0–149)
VLDL Cholesterol Cal: 15 mg/dL (ref 5–40)

## 2017-08-18 LAB — HEPATIC FUNCTION PANEL
ALK PHOS: 58 IU/L (ref 39–117)
ALT: 21 IU/L (ref 0–44)
AST: 27 IU/L (ref 0–40)
Albumin: 4.9 g/dL (ref 3.5–5.5)
BILIRUBIN, DIRECT: 0.14 mg/dL (ref 0.00–0.40)
Bilirubin Total: 0.7 mg/dL (ref 0.0–1.2)
Total Protein: 7.3 g/dL (ref 6.0–8.5)

## 2017-08-18 LAB — MEASLES/MUMPS/RUBELLA IMMUNITY
MUMPS ABS, IGG: 116 AU/mL (ref >10.9)
RUBELLA: 21.9 {index} (ref >0.99)

## 2017-08-18 LAB — VARICELLA ZOSTER ANTIBODY, IGG: Varicella zoster IgG: 805 index (ref >165)

## 2017-09-21 ENCOUNTER — Telehealth: Payer: Self-pay | Admitting: Medical

## 2017-09-21 NOTE — Telephone Encounter (Signed)
Did he request this?  I think he is out of the country, and probably doesn't need refill on this?

## 2017-09-21 NOTE — Telephone Encounter (Signed)
Recv'd fax refill request for Acetazolamid 125mg  #60 to [facility]

## 2017-09-22 NOTE — Telephone Encounter (Signed)
Called pharmacy and was automated refill, this was cancelled

## 2017-12-04 ENCOUNTER — Telehealth: Payer: Self-pay | Admitting: Medical

## 2017-12-04 NOTE — Telephone Encounter (Signed)
Please try and get sleep study results from [facility] from about a year ago.

## 2017-12-16 NOTE — Telephone Encounter (Signed)
Any word on sleep study and this prior message?

## 2017-12-18 NOTE — Telephone Encounter (Signed)
No response yet. Called office again

## 2017-12-21 NOTE — Telephone Encounter (Signed)
Let him know we have called the office and requested records twice now.  He may want to call and put a little pressure on them as well to get records too [place].

## 2017-12-22 NOTE — Telephone Encounter (Signed)
Left message on voicemail for patient to call back. 

## 2017-12-29 NOTE — Telephone Encounter (Signed)
Left message on voicemail for patient to call back. 

## 2017-12-30 NOTE — Telephone Encounter (Signed)
Closing this chart due to patient has not returned my calls

## 2018-01-01 ENCOUNTER — Telehealth: Payer: Self-pay | Admitting: Medical

## 2018-01-01 NOTE — Telephone Encounter (Signed)
I finally received prior sleep study.  Please forward to home health for CPAP trial.  See if he has preference or use [facility].

## 2018-01-04 ENCOUNTER — Other Ambulatory Visit: Payer: Self-pay

## 2018-01-05 ENCOUNTER — Other Ambulatory Visit: Payer: Self-pay

## 2018-01-05 DIAGNOSIS — G4733 Obstructive sleep apnea (adult) (pediatric): Secondary | ICD-10-CM

## 2018-01-05 NOTE — Telephone Encounter (Signed)
CPAP order for [facility] sent.

## 2018-01-13 ENCOUNTER — Other Ambulatory Visit: Payer: Self-pay | Admitting: Internal Medicine

## 2018-01-13 DIAGNOSIS — G4733 Obstructive sleep apnea (adult) (pediatric): Secondary | ICD-10-CM

## 2018-03-10 ENCOUNTER — Other Ambulatory Visit (INDEPENDENT_AMBULATORY_CARE_PROVIDER_SITE_OTHER): Payer: 59

## 2018-03-10 DIAGNOSIS — Z23 Encounter for immunization: Secondary | ICD-10-CM | POA: Diagnosis not present

## 2018-08-19 ENCOUNTER — Ambulatory Visit (INDEPENDENT_AMBULATORY_CARE_PROVIDER_SITE_OTHER): Payer: 59 | Admitting: Medical

## 2018-08-19 ENCOUNTER — Encounter: Payer: Self-pay | Admitting: Medical

## 2018-08-19 ENCOUNTER — Other Ambulatory Visit: Payer: Self-pay

## 2018-08-19 VITALS — BP 120/70 | HR 81 | Temp 98.7°F | Resp 16 | Ht 67.0 in | Wt 150.8 lb

## 2018-08-19 DIAGNOSIS — Z0000 Encounter for general adult medical examination without abnormal findings: Secondary | ICD-10-CM | POA: Diagnosis not present

## 2018-08-19 DIAGNOSIS — Z113 Encounter for screening for infections with a predominantly sexual mode of transmission: Secondary | ICD-10-CM

## 2018-08-19 DIAGNOSIS — Z7189 Other specified counseling: Secondary | ICD-10-CM | POA: Diagnosis not present

## 2018-08-19 DIAGNOSIS — Z7185 Encounter for immunization safety counseling: Secondary | ICD-10-CM | POA: Insufficient documentation

## 2018-08-19 NOTE — Progress Notes (Signed)
Subjective:   HPI  Charles Bartlett is a 43 y.o. male who presents for Chief Complaint  Patient presents with  . CPE    fasting CPE  goes to eye dr     Patient Care Team: Marchell Froman, Kermit Baloavid S, PA-C as PCP - General (Family Medicine) Sees dentist Sees eye doctor  Concerns: Doing well, no particular complaints  He did go on his [place] trip this past year and really enjoyed it  He may be going to [place] when travel restrictions allow.  He has questions about yellow fever vaccine he is never had this before.  Reviewed their medical, surgical, family, social, medication, and allergy history and updated chart as appropriate.  Past Medical History:  Diagnosis Date  . H/O echocardiogram 04/2017   mild LVH, Dr. Jacinto HalimGanji; eval due to chest discomfort  . Wears contact lenses     Past Surgical History:  Procedure Laterality Date  . WISDOM TOOTH EXTRACTION      Social History   Socioeconomic History  . Marital status: Divorced    Spouse name: Aura  . Number of children: 2  . Years of education: Master's  . Highest education level: Not on file  Occupational History  . Occupation: Art gallery managerengineer  Social Needs  . Financial resource strain: Not on file  . Food insecurity    Worry: Not on file    Inability: Not on file  . Transportation needs    Medical: Not on file    Non-medical: Not on file  Tobacco Use  . Smoking status: Never Smoker  . Smokeless tobacco: Never Used  Substance and Sexual Activity  . Alcohol use: No  . Drug use: No  . Sexual activity: Not on file  Lifestyle  . Physical activity    Days per week: Not on file    Minutes per session: Not on file  . Stress: Not on file  Relationships  . Social Musicianconnections    Talks on phone: Not on file    Gets together: Not on file    Attends religious service: Not on file    Active member of club or organization: Not on file    Attends meetings of clubs or organizations: Not on file    Relationship status: Not on file  .  Intimate partner violence    Fear of current or ex partner: Not on file    Emotionally abused: Not on file    Physically abused: Not on file    Forced sexual activity: Not on file  Other Topics Concern  . Not on file  Social History Narrative   Lives alone.   Divorced. Has two children and his stepson.  Originally from [place].  [place] in [place], [place]  ComptrollerMechanical Engineer.  Exercises 5 days per week.  07/2018    Family History  Problem Relation Age of Onset  . Other Mother        old age     Current Outpatient Medications:  .  Cyanocobalamin (B-12) 2500 MCG TABS, Take 1 tablet by mouth daily., Disp: , Rfl:  .  Multiple Vitamin (MULTIVITAMIN WITH MINERALS) TABS, Take 1 tablet by mouth daily., Disp: , Rfl:  .  Omega-3 Fatty Acids (SUPER OMEGA 3) 500 MG CAPS, Take 1 capsule by mouth daily., Disp: , Rfl:  .  acetaZOLAMIDE (DIAMOX) 125 MG tablet, 1 tablet po BID, start 24 hours before day of ascent, d/c 2-3 days after descent (Patient not taking: Reported on 08/19/2018), Disp:  60 tablet, Rfl: 0 .  atovaquone-proguanil (MALARONE) 250-100 MG TABS tablet, Start 2 days before exposure, 1 tablet daily, d/c 7 days after exposure.  Take with food or milk (Patient not taking: Reported on 08/19/2018), Disp: 40 tablet, Rfl: 0  Allergies  Allergen Reactions  . Gelatin        Review of Systems Constitutional: -fever, -chills, -sweats, -unexpected weight change, -decreased appetite, -fatigue Allergy: -sneezing, -itching, -congestion Dermatology: -changing moles, --rash, -lumps ENT: -runny nose, -ear pain, -sore throat, -hoarseness, -sinus pain, -teeth pain, - ringing in ears, -hearing loss, -nosebleeds Cardiology: -chest pain, -palpitations, -swelling, -difficulty breathing when lying flat, -waking up short of breath Respiratory: -cough, -shortness of breath, -difficulty breathing with exercise or exertion, -wheezing, -coughing up blood Gastroenterology: -abdominal pain,  -nausea, -vomiting, -diarrhea, -constipation, -blood in stool, -changes in bowel movement, -difficulty swallowing or eating Hematology: -bleeding, -bruising  Musculoskeletal: -joint aches, -muscle aches, -joint swelling, -back pain, -neck pain, -cramping, -changes in gait Ophthalmology: denies vision changes, eye redness, itching, discharge Urology: -burning with urination, -difficulty urinating, -blood in urine, -urinary frequency, -urgency, -incontinence Neurology: -headache, -weakness, -tingling, -numbness, -memory loss, -falls, -dizziness Psychology: -depressed mood, -agitation, -sleep problems Male GU: no testicular mass, pain, no lymph nodes swollen, no swelling, no rash.     Objective:  BP 120/70   Pulse 81   Temp 98.7 F (37.1 C) (Temporal)   Resp 16   Ht 5\' 7"  (1.702 m)   Wt 150 lb 12.8 oz (68.4 kg)   SpO2 98%   BMI 23.62 kg/m   General appearance: alert, no distress, WD/WN, [place] male Skin: Few scattered benign papular lesions on left neck, lower back, abdomen.  No worrisome lesions HEENT: normocephalic, conjunctiva/corneas normal, sclerae anicteric, PERRLA, EOMi, nares patent, no discharge or erythema, pharynx normal Oral cavity: MMM, tongue normal, teeth normal Neck: supple, no lymphadenopathy, no thyromegaly, no masses, normal ROM, no bruits Chest: non tender, normal shape and expansion Heart: RRR, normal S1, S2, no murmurs Lungs: CTA bilaterally, no wheezes, rhonchi, or rales Abdomen: +bs, soft, non tender, non distended, no masses, no hepatomegaly, no splenomegaly, no bruits Back: non tender, normal ROM, no scoliosis Musculoskeletal: upper extremities non tender, no obvious deformity, normal ROM throughout, lower extremities non tender, no obvious deformity, normal ROM throughout Extremities: no edema, no cyanosis, no clubbing Pulses: 2+ symmetric, upper and lower extremities, normal cap refill Neurological: alert, oriented x 3, CN2-12 intact, strength  normal upper extremities and lower extremities, sensation normal throughout, DTRs 2+ throughout, no cerebellar signs, gait normal Psychiatric: normal affect, behavior normal, pleasant  GU: normal male external genitalia,circumcised, nontender, no masses, no hernia, no lymphadenopathy Rectal: Deferred   Assessment and Plan :   Encounter Diagnoses  Name Primary?  . Encounter for health maintenance examination in adult Yes  . Screen for STD (sexually transmitted disease)   . Vaccine counseling     Physical exam - discussed and counseled on healthy lifestyle, diet, exercise, preventative care, vaccinations, sick and well care, proper use of emergency dept and after hours care, and addressed their concerns.    Health screening: See your eye doctor yearly for routine vision care. See your dentist yearly for routine dental care including hygiene visits twice yearly.  Discussed STD testing, discussed prevention, condom use, means of transmission  Cancer screening Advised monthly self testicular exam  Vaccinations: Advised yearly influenza vaccine  He will get a yellow fever vaccine at the health department.  And I advised yellow fever titer in the  next several years if he has travel again within 5 years   Separate significant chronic issues discussed: We reviewed his 2017 sleep study showing mild sleep apnea.  I advise he not sleep on his back.  Continue regular exercise.  Discussed other methods of treatment for sleep apnea including mouthpiece, avoid sedative type medications.  He declines any type of CPAP device at this time.  His AHI was less than 9  Menno was seen today for cpe.  Diagnoses and all orders for this visit:  Encounter for health maintenance examination in adult -     Comprehensive metabolic panel -     CBC -     HIV Antibody (routine testing w rflx) -     RPR -     GC/Chlamydia Probe Amp  Screen for STD (sexually transmitted disease) -     HIV Antibody  (routine testing w rflx) -     RPR -     GC/Chlamydia Probe Amp  Vaccine counseling    Follow-up pending labs, yearly for physical

## 2018-08-20 LAB — COMPREHENSIVE METABOLIC PANEL
ALT: 34 IU/L (ref 0–44)
AST: 27 IU/L (ref 0–40)
Albumin/Globulin Ratio: 1.8 (ref 1.2–2.2)
Albumin: 4.9 g/dL (ref 4.0–5.0)
Alkaline Phosphatase: 62 IU/L (ref 39–117)
BUN/Creatinine Ratio: 16 (ref 9–20)
BUN: 14 mg/dL (ref 6–24)
Bilirubin Total: 0.5 mg/dL (ref 0.0–1.2)
CO2: 24 mmol/L (ref 20–29)
Calcium: 9.9 mg/dL (ref 8.7–10.2)
Chloride: 98 mmol/L (ref 96–106)
Creatinine, Ser: 0.89 mg/dL (ref 0.76–1.27)
GFR calc Af Amer: 123 mL/min/{1.73_m2} (ref >59)
GFR calc non Af Amer: 106 mL/min/{1.73_m2} (ref >59)
Globulin, Total: 2.8 g/dL (ref 1.5–4.5)
Glucose: 87 mg/dL (ref 65–99)
Potassium: 4.3 mmol/L (ref 3.5–5.2)
Sodium: 139 mmol/L (ref 134–144)
Total Protein: 7.7 g/dL (ref 6.0–8.5)

## 2018-08-20 LAB — CBC
Hematocrit: 46.5 % (ref 37.5–51.0)
Hemoglobin: 15.9 g/dL (ref 13.0–17.7)
MCH: 29.9 pg (ref 26.6–33.0)
MCHC: 34.2 g/dL (ref 31.5–35.7)
MCV: 88 fL (ref 79–97)
Platelets: 207 10*3/uL (ref 150–450)
RBC: 5.31 x10E6/uL (ref 4.14–5.80)
RDW: 13.5 % (ref 11.6–15.4)
WBC: 4.7 10*3/uL (ref 3.4–10.8)

## 2018-08-20 LAB — RPR: RPR Ser Ql: NONREACTIVE

## 2018-08-20 LAB — HIV ANTIBODY (ROUTINE TESTING W REFLEX): HIV Screen 4th Generation wRfx: NONREACTIVE

## 2018-08-20 LAB — GC/CHLAMYDIA PROBE AMP
Chlamydia trachomatis, NAA: NEGATIVE
Neisseria Gonorrhoeae by PCR: NEGATIVE

## 2018-09-14 ENCOUNTER — Other Ambulatory Visit: Payer: Self-pay

## 2018-09-14 DIAGNOSIS — Z20828 Contact with and (suspected) exposure to other viral communicable diseases: Secondary | ICD-10-CM

## 2018-09-14 DIAGNOSIS — Z20822 Contact with and (suspected) exposure to covid-19: Secondary | ICD-10-CM

## 2018-09-16 LAB — NOVEL CORONAVIRUS, NAA: SARS-CoV-2, NAA: NOT DETECTED

## 2019-01-24 ENCOUNTER — Encounter: Payer: Self-pay | Admitting: Medical

## 2019-01-24 ENCOUNTER — Other Ambulatory Visit: Payer: Self-pay

## 2019-01-24 ENCOUNTER — Ambulatory Visit (INDEPENDENT_AMBULATORY_CARE_PROVIDER_SITE_OTHER): Payer: 59 | Admitting: Medical

## 2019-01-24 VITALS — BP 120/82 | HR 95 | Temp 98.6°F | Ht 67.0 in | Wt 156.2 lb

## 2019-01-24 DIAGNOSIS — Z113 Encounter for screening for infections with a predominantly sexual mode of transmission: Secondary | ICD-10-CM | POA: Diagnosis not present

## 2019-01-24 DIAGNOSIS — Z20828 Contact with and (suspected) exposure to other viral communicable diseases: Secondary | ICD-10-CM | POA: Diagnosis not present
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
## 2019-01-25 LAB — HSV(HERPES SIMPLEX VRS) I + II AB-IGM: HSVI/II Comb IgM: <0.91 Ratio (ref 0.00–0.90)

## 2019-01-25 LAB — HSV 2 ANTIBODY, IGG: HSV 2 IgG, Type Spec: <0.91 index (ref 0.00–0.90)

## 2019-01-25 LAB — HSV 1 ANTIBODY, IGG: HSV 1 Glycoprotein G Ab, IgG: <0.91 index (ref 0.00–0.90)

## 2019-05-05 ENCOUNTER — Ambulatory Visit: Payer: Self-pay | Attending: Internal Medicine

## 2019-05-05 DIAGNOSIS — Z23 Encounter for immunization: Secondary | ICD-10-CM

## 2019-05-05 NOTE — Progress Notes (Signed)
   Covid-19 Vaccination Clinic  Name:  Jorrell Kuster    MRN: [id number] DOB: 1975/09/21  05/05/2019  Mr. Guedes was observed post Covid-19 immunization for 15 minutes without incident. He was provided with Vaccine Information Sheet and instruction to access the V-Safe system.   Mr. Buntyn was instructed to call 911 with any severe reactions post vaccine: Marland Kitchen Difficulty breathing  . Swelling of face and throat  . A fast heartbeat  . A bad rash all over body  . Dizziness and weakness   Immunizations Administered    Name Date Dose VIS Date Route   Pfizer COVID-19 Vaccine 05/05/2019  3:05 PM 0.3 mL 02/04/2019 Intramuscular   Manufacturer: [employer], [employer]   Lot: [id number]   NDC: [address]-4

## 2019-05-06 ENCOUNTER — Ambulatory Visit: Payer: Self-pay

## 2019-05-10 ENCOUNTER — Ambulatory Visit: Payer: 59 | Admitting: Medical

## 2019-05-16 ENCOUNTER — Ambulatory Visit: Payer: 59 | Admitting: Medical

## 2019-05-16 ENCOUNTER — Other Ambulatory Visit: Payer: Self-pay

## 2019-05-16 ENCOUNTER — Encounter: Payer: Self-pay | Admitting: Medical

## 2019-05-16 VITALS — BP 122/80 | HR 78 | Temp 98.3°F | Ht 67.0 in | Wt 158.8 lb

## 2019-05-16 DIAGNOSIS — Z7189 Other specified counseling: Secondary | ICD-10-CM

## 2019-05-16 DIAGNOSIS — Z7185 Encounter for immunization safety counseling: Secondary | ICD-10-CM

## 2019-05-16 DIAGNOSIS — Z202 Contact with and (suspected) exposure to infections with a predominantly sexual mode of transmission: Secondary | ICD-10-CM | POA: Insufficient documentation

## 2019-05-16 DIAGNOSIS — M79675 Pain in left toe(s): Secondary | ICD-10-CM | POA: Diagnosis not present

## 2019-05-16 DIAGNOSIS — Z113 Encounter for screening for infections with a predominantly sexual mode of transmission: Secondary | ICD-10-CM | POA: Diagnosis not present

## 2019-05-16 NOTE — Progress Notes (Signed)
Subjective: Chief Complaint  Patient presents with  . Follow-up    with blood work   . Toe Pain    2nd left toe-no known injury    He notes pain in second left toe.  No injury, no trauma, not swollen.   Just started hurting yesterday afternoon.  He got his first Covid vaccine about a week ago.  He did start wearing some new shoes this past week on and off, not sure if that had an impact.  No other toes hurt.  No numbness or tingling  Here for follow-up on STD screen.  At his visit in November he had become sexually active again with his ex-wife.  They started talking again and started having relationship again.  She had informed him that she had a partner before him when they were separated that had advised her that he had genital herpes.  She had not had any outbreak or signs of infection.  He was only willing to make sure he did not contract this as well.  Here today for repeat testing in general  No other complaint   ROS as in subjective   Objective: BP 122/80   Pulse 78   Temp 98.3 F (36.8 C)   Ht 5\' 7"  (1.702 m)   Wt 158 lb 12.8 oz (72 kg)   SpO2 97%   BMI 24.87 kg/m   General: Well-built well-nourished no acute distress MSK: Left second toe with slight swollen appearance of the PIP and DIP, slight pink-red coloration or inflammation, mild pain with toe flexion, tender along PIP and DIP and tender with passive flexion.  No other deformity.  Rest of toes nontender.  No obvious break in the skin.  Rest of foot exam unremarkable Feet and toes neurovascularly intact    Assessment: Encounter Diagnoses  Name Primary?  . Venereal disease contact Yes  . Screen for STD (sexually transmitted disease)   . Vaccine counseling   . Toe pain, left     Plan: Repeat screening for STD today.  Discussed last visit and potential exposure.  Discussed safe sex.  We discussed his joint pain and recent Covid vaccine #1.  It is not clear if the 2 are related.  He also just tried some new  shoes that may have been a little snug in the toes.  He has no other joint pain.  Advised ibuprofen or Aleve for the next 3 to 4 days, avoid activity that would cause irritation to the toe or strenuous activity.  Call or return if new or worsening symptoms or if not improving the next 4 to 5 days.  Vitali was seen today for follow-up and toe pain.  Diagnoses and all orders for this visit:  Venereal disease contact -     HIV Antibody (routine testing w rflx) -     RPR -     GC/Chlamydia Probe Amp -     HSV 1 antibody, IgG -     HSV 2 antibody, IgG -     Hepatitis C antibody -     Hepatitis B surface antigen  Screen for STD (sexually transmitted disease) -     HIV Antibody (routine testing w rflx) -     RPR -     GC/Chlamydia Probe Amp -     HSV 1 antibody, IgG -     HSV 2 antibody, IgG -     Hepatitis C antibody -     Hepatitis B surface antigen  Vaccine counseling  Toe pain, left

## 2019-05-17 LAB — RPR: RPR Ser Ql: NONREACTIVE

## 2019-05-17 LAB — GC/CHLAMYDIA PROBE AMP
Chlamydia trachomatis, NAA: NEGATIVE
Neisseria Gonorrhoeae by PCR: NEGATIVE

## 2019-05-17 LAB — HIV ANTIBODY (ROUTINE TESTING W REFLEX): HIV Screen 4th Generation wRfx: NONREACTIVE

## 2019-05-17 LAB — HSV 2 ANTIBODY, IGG: HSV 2 IgG, Type Spec: <0.91 index (ref 0.00–0.90)

## 2019-05-17 LAB — HEPATITIS C ANTIBODY: Hep C Virus Ab: <0.1 s/co ratio (ref 0.0–0.9)

## 2019-05-17 LAB — HSV 1 ANTIBODY, IGG: HSV 1 Glycoprotein G Ab, IgG: <0.91 index (ref 0.00–0.90)

## 2019-05-17 LAB — HEPATITIS B SURFACE ANTIGEN: Hepatitis B Surface Ag: NEGATIVE

## 2019-05-26 ENCOUNTER — Ambulatory Visit: Payer: 59 | Admitting: Medical

## 2019-05-30 ENCOUNTER — Ambulatory Visit: Payer: Self-pay | Attending: Internal Medicine

## 2019-05-30 DIAGNOSIS — Z23 Encounter for immunization: Secondary | ICD-10-CM

## 2019-05-30 NOTE — Progress Notes (Signed)
   Covid-19 Vaccination Clinic  Name:  Benjamine Strout    MRN: [id number] DOB: July 31, 1975  05/30/2019  Mr. Hammack was observed post Covid-19 immunization for 15 minutes without incident. He was provided with Vaccine Information Sheet and instruction to access the V-Safe system.   Mr. Coppola was instructed to call 911 with any severe reactions post vaccine: Marland Kitchen Difficulty breathing  . Swelling of face and throat  . A fast heartbeat  . A bad rash all over body  . Dizziness and weakness   Immunizations Administered    Name Date Dose VIS Date Route   Pfizer COVID-19 Vaccine 05/30/2019  2:46 PM 0.3 mL 02/04/2019 Intramuscular   Manufacturer: [employer], [employer]   Lot: [id number]   NDC: [address]-3

## 2019-08-22 ENCOUNTER — Encounter: Payer: Self-pay | Admitting: Medical

## 2019-08-22 ENCOUNTER — Other Ambulatory Visit: Payer: Self-pay

## 2019-08-22 ENCOUNTER — Ambulatory Visit: Payer: No Typology Code available for payment source | Admitting: Medical

## 2019-08-22 VITALS — BP 118/80 | HR 73 | Ht 67.0 in | Wt 156.2 lb

## 2019-08-22 DIAGNOSIS — Z13220 Encounter for screening for lipoid disorders: Secondary | ICD-10-CM | POA: Diagnosis not present

## 2019-08-22 DIAGNOSIS — Z0000 Encounter for general adult medical examination without abnormal findings: Secondary | ICD-10-CM | POA: Diagnosis not present

## 2019-08-22 LAB — CBC
Hematocrit: 44.7 % (ref 37.5–51.0)
Hemoglobin: 14.9 g/dL (ref 13.0–17.7)
MCH: 29.7 pg (ref 26.6–33.0)
MCHC: 33.3 g/dL (ref 31.5–35.7)
MCV: 89 fL (ref 79–97)
Platelets: 236 10*3/uL (ref 150–450)
RBC: 5.01 x10E6/uL (ref 4.14–5.80)
RDW: 13 % (ref 11.6–15.4)
WBC: 6.1 10*3/uL (ref 3.4–10.8)

## 2019-08-22 LAB — COMPREHENSIVE METABOLIC PANEL
ALT: 24 IU/L (ref 0–44)
AST: 29 IU/L (ref 0–40)
Albumin/Globulin Ratio: 1.7 (ref 1.2–2.2)
Albumin: 4.8 g/dL (ref 4.0–5.0)
Alkaline Phosphatase: 61 IU/L (ref 48–121)
BUN/Creatinine Ratio: 21 — ABNORMAL HIGH (ref 9–20)
BUN: 21 mg/dL (ref 6–24)
Bilirubin Total: 0.5 mg/dL (ref 0.0–1.2)
CO2: 25 mmol/L (ref 20–29)
Calcium: 10.3 mg/dL — ABNORMAL HIGH (ref 8.7–10.2)
Chloride: 99 mmol/L (ref 96–106)
Creatinine, Ser: 1.02 mg/dL (ref 0.76–1.27)
GFR calc Af Amer: 104 mL/min/{1.73_m2} (ref >59)
GFR calc non Af Amer: 90 mL/min/{1.73_m2} (ref >59)
Globulin, Total: 2.9 g/dL (ref 1.5–4.5)
Glucose: 79 mg/dL (ref 65–99)
Potassium: 4.5 mmol/L (ref 3.5–5.2)
Sodium: 138 mmol/L (ref 134–144)
Total Protein: 7.7 g/dL (ref 6.0–8.5)

## 2019-08-22 LAB — LIPID PANEL
Chol/HDL Ratio: 3.4 ratio (ref 0.0–5.0)
Cholesterol, Total: 210 mg/dL — ABNORMAL HIGH (ref 100–199)
HDL: 61 mg/dL (ref >39)
LDL Chol Calc (NIH): 126 mg/dL — ABNORMAL HIGH (ref 0–99)
Triglycerides: 132 mg/dL (ref 0–149)
VLDL Cholesterol Cal: 23 mg/dL (ref 5–40)

## 2019-08-22 NOTE — Patient Instructions (Signed)
Preventative Care for Adults - Male    Thank you for coming in for your well visit today, and thank you for trusting [place] with your care!   Maintain regular health and wellness exams:  A routine yearly physical is a good way to check in with your primary care provider about your health and preventive screening. It is also an opportunity to share updates about your health and any concerns you have, and receive a thorough all-over exam.   Most health insurance companies pay for at least some preventative services.  Check with your health plan for specific coverages.  What preventative services do men need?  Adult men should have their weight and blood pressure checked regularly.   Men age 59 and older should have their cholesterol levels checked regularly.  Beginning at age 24 and continuing to age 73, men should be screened for colorectal cancer.  Certain people may need continued testing until age 46.  Updating vaccinations is part of preventative care.  Vaccinations help protect against diseases such as the flu.  Osteoporosis is a disease in which the bones lose minerals and strength as we age. Men ages 27 and over should discuss this with their caregivers  Lab tests are generally done as part of preventative care to screen for anemia and blood disorders, to screen for problems with the kidneys and liver, to screen for bladder problems, to check blood sugar, and to check your cholesterol level.  Preventative services generally include counseling about diet, exercise, avoiding tobacco, drugs, excessive alcohol consumption, and sexually transmitted infections.   Xrays and CT scans are not normally done as a preventative test, and most insurances do not pay for imaging for screening other than as discussed under cancer screens below.   On the other hand, if you have certain medical concerns, imaging may be necessary as a diagnostic test.    Your Medical Team Your medical team starts with  [place], your PCP or primary care provider.  Please use our services for your routine care such as physicals, screenings, immunizations, sick visits, and your first stop for general medical concerns.  You can call our number for after hours information for urgent questions that may need attention but cannot wait til the next business day.    Urgent care-urgent cares exist to provide care when your primary care office would typically be closed such as evenings or weekends.   Urgent care is for evaluation of urgent medical problems that do not necessarily require emergency department care, but cannot wait til the next business day when we are open.  Emergency department care-please reserve emergency department care for serious, urgent, possibly life-threatening medical problems.  This includes issues like possible stroke, heart attack, significant injury, mental health crisis, or other urgent need that requires immediate medical attention.     See your dentist office twice yearly for hygiene and cleaning visits.   Brush your teeth and floss your teeth daily.  See your eye doctor yearly for routine eye exam and screenings for glaucoma and retinal disease.    Vaccines:  Stay up to date with your tetanus shots and other required immunizations. You should have a booster for tetanus every 10 years. Be sure to get your flu shot every year, since 5%-20% of the [place] population comes down with the flu. The flu vaccine changes each year, so being vaccinated once is not enough. Get your shot in the fall, before the flu season peaks.   Other vaccines to  consider:  Pneumococcal vaccine to protect against certain types of pneumonia.  This is normally recommended for adults age 26 or older.  However, adults younger than 43 years old with certain underlying conditions such as diabetes, heart or lung disease should also receive the vaccine.  Shingles vaccine to protect against Varicella Zoster if you are older than age  11, or younger than 43 years old with certain underlying illness.  If you have not had the Shingrix vaccine, please call your insurer to inquire about coverage for the Shingrix vaccine given in 2 doses.   Some insurers cover this vaccine after age 92, some cover this after age 76.  If your insurer covers this, then call to schedule appointment to have this vaccine here  Hepatitis A vaccine to protect against a form of infection of the liver by a virus acquired from food.  Hepatitis B vaccine to protect against a form of infection of the liver by a virus acquired from blood or body fluids, particularly if you work in health care.  If you plan to travel internationally, check with your local health department for specific vaccination recommendations.  Human Papilloma Virus or HPV causes cancer of the cervix, and other infections that can be transmitted from person to person. There is a vaccine for HPV, and males should get immunized between the ages of 81 and 44. It requires a series of 3 shots.   Covid/Coronavirus - as the vaccines are becoming available, please consider vaccination if you are a health care worker, first responder, or have significant health problems such as asthma, COPD, heart disease, hypertension, diabetes, obesity, multiple medical problems, over age 18yo, or immunocompromised.    You are up to date   What should I know about Cancer screening? Many types of cancers can be detected early and may often be prevented. Lung Cancer  You should be screened every year for lung cancer if: ? You are a current smoker who has smoked for at least 30 years. ? You are a former smoker who has quit within the past 15 years.  Talk to your health care provider about your screening options, when you should start screening, and how often you should be screened.  Colorectal Cancer  Routine colorectal cancer screening usually begins at 43 years of age and should be repeated every 5-10 years  until you are 43 years old. You may need to be screened more often if early forms of precancerous polyps or small growths are found. Your health care provider may recommend screening at an earlier age if you have risk factors for colon cancer.  Your health care provider may recommend using home test kits to check for hidden blood in the stool.  A small camera at the end of a tube can be used to examine your colon (sigmoidoscopy or colonoscopy). This checks for the earliest forms of colorectal cancer.  Prostate and Testicular Cancer  Depending on your age and overall health, your health care provider may do certain tests to screen for prostate and testicular cancer.  Talk to your health care provider about any symptoms or concerns you have about testicular or prostate cancer.  Skin Cancer  Check your skin from head to toe regularly.  Tell your health care provider about any new moles or changes in moles, especially if: ? There is a change in a mole's size, shape, or color. ? You have a mole that is larger than a pencil eraser.  Always use sunscreen. Apply  sunscreen liberally and repeat throughout the day.  Protect yourself by wearing long sleeves, pants, a wide-brimmed hat, and sunglasses when outside.   GENERAL RECOMMENDATIONS FOR GOOD HEALTH:  Healthy diet:  Eat a variety of foods, including fruit, vegetables, animal or vegetable protein, such as meat, fish, chicken, and eggs, or beans, lentils, tofu, and grains, such as rice.  Drink plenty of water daily.  Decrease saturated fat in the diet, avoid lots of red meat, processed foods, sweets, fast foods, and fried foods.  Exercise:  Aerobic exercise helps maintain good heart health. At least 30-40 minutes of moderate-intensity exercise is recommended. For example, a brisk walk that increases your heart rate and breathing. This should be done on most days of the week.   Find a type of exercise or a variety of exercises that you  enjoy so that it becomes a part of your daily life.  Examples are running, walking, swimming, water aerobics, and biking.  For motivation and support, explore group exercise such as aerobic class, spin class, Zumba, Yoga,or  martial arts, etc.    Set exercise goals for yourself, such as a certain weight goal, walk or run in a race such as a 5k walk/run.  Speak to your primary care provider about exercise goals.  Your weight readings per our records: Wt Readings from Last 3 Encounters:  08/22/19 156 lb 3.2 oz (70.9 kg)  05/16/19 158 lb 12.8 oz (72 kg)  01/24/19 156 lb 3.2 oz (70.9 kg)    Body mass index is 24.46 kg/m.    Disease prevention:  If you smoke or chew tobacco, find out from your caregiver how to quit. It can literally save your life, no matter how long you have been a tobacco user. If you do not use tobacco, never begin.   Maintain a healthy diet and normal weight. Increased weight leads to problems with blood pressure and diabetes.   The Body Mass Index or BMI is a way of measuring how much of your body is fat. Having a BMI above 27 increases the risk of heart disease, diabetes, hypertension, stroke and other problems related to obesity. Your caregiver can help determine your BMI and based on it develop an exercise and dietary program to help you achieve or maintain this important measurement at a healthful level.  High blood pressure causes heart and blood vessel problems.  Persistent high blood pressure should be treated with medicine if weight loss and exercise do not work.  Your blood pressure readings per our records:     BP Readings from Last 3 Encounters:  08/22/19 118/80  05/16/19 122/80  01/24/19 120/82     Fat and cholesterol leaves deposits in your arteries that can block them. This causes heart disease and vessel disease elsewhere in your body.  If your cholesterol is found to be high, or if you have heart disease or certain other medical conditions, then you  may need to have your cholesterol monitored frequently and be treated with medication.   Ask if you should have a cardiac stress test if your history suggests this. A stress test is a test done on a treadmill that looks for heart disease. This test can find disease prior to there being a problem.    Osteoporosis is a disease in which the bones lose minerals and strength as we age. This can result in serious bone fractures. Risk of osteoporosis can be identified using a bone density scan. Men ages 54 and over should  discuss this with their caregivers. Ask your caregiver whether you should be taking a calcium supplement and Vitamin D, to reduce the rate of osteoporosis.   Avoid drinking alcohol in excess (more than two drinks per day).  Avoid use of street drugs. Do not share needles with anyone. Ask for professional help if you need assistance or instructions on stopping the use of alcohol, cigarettes, and/or drugs.  Brush your teeth twice a day with fluoride toothpaste, and floss once a day. Good oral hygiene prevents tooth decay and gum disease. The problems can be painful, unattractive, and can cause other health problems. Visit your dentist for a routine oral and dental check up and preventive care every 6-12 months.   Safety:  Use seatbelts 100% of the time, whether driving or as a passenger.  Use safety devices such as hearing protection if you work in environments with loud noise or significant background noise.  Use safety glasses when doing any work that could send debris in to the eyes.  Use a helmet if you ride a bike or motorcycle.  Use appropriate safety gear for contact sports.  Talk to your caregiver about gun safety.  Use sunscreen with a SPF (or skin protection factor) of 15 or greater.  Lighter skinned people are at a greater risk of skin cancer. Don't forget to also wear sunglasses in order to protect your eyes from too much damaging sunlight. Damaging sunlight can accelerate  cataract formation.   Keep carbon monoxide and smoke detectors in your home functioning at all times. Change the batteries every 6 months or use a model that plugs into the wall.    Sexual activity: . Sex is a normal part of life and sexual activity can continue into older adulthood for many healthy people.   . If you are having erectile dysfunction issues, please follow up to discuss this further.   . If you are not in a monogamous relationship or have more than one partner, please practice safe sex.  Use condoms. Condoms are used for birth control and to help reduce the spread of sexually transmitted infections (or STIs).  Some of the STIs are gonorrhea (the clap), chlamydia, syphilis, trichomonas, herpes, HPV (human papilloma virus) and HIV (human immunodeficiency virus) which causes AIDS. The herpes, HIV and HPV are viral illnesses that have no cure. These can result in disability, cancer and death.   We are able to test for STIs here at our office.

## 2019-08-22 NOTE — Progress Notes (Signed)
Subjective:   HPI  Charles Bartlett is a 44 y.o. male who presents for Chief Complaint  Patient presents with  . Annual Exam    with fasting labs     Patient Care Team: Anastasha Ortez, Camelia Eng, PA-C as PCP - General (Family Medicine) Sees dentist Sees eye doctor  Concerns: Doing well, no particular complaints  Reviewed their medical, surgical, family, social, medication, and allergy history and updated chart as appropriate.  Past Medical History:  Diagnosis Date  . H/O echocardiogram 04/2017   mild LVH, Dr. Einar Gip; eval due to chest discomfort  . Wears contact lenses     Past Surgical History:  Procedure Laterality Date  . WISDOM TOOTH EXTRACTION      Social History   Socioeconomic History  . Marital status: Divorced    Spouse name: Aura  . Number of children: 2  . Years of education: Master's  . Highest education level: Not on file  Occupational History  . Occupation: Chief Financial Officer  Tobacco Use  . Smoking status: Never Smoker  . Smokeless tobacco: Never Used  Vaping Use  . Vaping Use: Never used  Substance and Sexual Activity  . Alcohol use: No  . Drug use: No  . Sexual activity: Not on file  Other Topics Concern  . Not on file  Social History Narrative   Lives alone.   Divorced. Has two teenage children and his stepson.  Originally from [place].  Lives in [place], works in [employer].  [place].  Exercises 5 days per week.  07/2019   Social Determinants of Health   Financial Resource Strain:   . Difficulty of Paying Living Expenses:   Food Insecurity:   . Worried About Charity fundraiser in the Last Year:   . Arboriculturist in the Last Year:   Transportation Needs:   . Film/video editor (Medical):   Marland Kitchen Lack of Transportation (Non-Medical):   Physical Activity:   . Days of Exercise per Week:   . Minutes of Exercise per Session:   Stress:   . Feeling of Stress :   Social Connections:   . Frequency of Communication with Friends and  Family:   . Frequency of Social Gatherings with Friends and Family:   . Attends Religious Services:   . Active Member of Clubs or Organizations:   . Attends Archivist Meetings:   Marland Kitchen Marital Status:   Intimate Partner Violence:   . Fear of Current or Ex-Partner:   . Emotionally Abused:   Marland Kitchen Physically Abused:   . Sexually Abused:     Family History  Problem Relation Age of Onset  . Other Mother        old age  . Cancer Neg Hx   . Heart disease Neg Hx   . Stroke Neg Hx   . Diabetes Neg Hx      Current Outpatient Medications:  Marland Kitchen  Multiple Vitamin (MULTIVITAMIN WITH MINERALS) TABS, Take 1 tablet by mouth daily., Disp: , Rfl:  .  Omega-3 Fatty Acids (SUPER OMEGA 3) 500 MG CAPS, Take 1 capsule by mouth daily., Disp: , Rfl:   Allergies  Allergen Reactions  . Gelatin      Review of Systems Constitutional: -fever, -chills, -sweats, -unexpected weight change, -decreased appetite, -fatigue Allergy: -sneezing, -itching, -congestion Dermatology: -changing moles, --rash, -lumps ENT: -runny nose, -ear pain, -sore throat, -hoarseness, -sinus pain, -teeth pain, - ringing in ears, -hearing loss, -nosebleeds Cardiology: -chest pain, -palpitations, -swelling, -difficulty breathing  when lying flat, -waking up short of breath Respiratory: -cough, -shortness of breath, -difficulty breathing with exercise or exertion, -wheezing, -coughing up blood Gastroenterology: -abdominal pain, -nausea, -vomiting, -diarrhea, -constipation, -blood in stool, -changes in bowel movement, -difficulty swallowing or eating Hematology: -bleeding, -bruising  Musculoskeletal: -joint aches, -muscle aches, -joint swelling, -back pain, -neck pain, -cramping, -changes in gait Ophthalmology: denies vision changes, eye redness, itching, discharge Urology: -burning with urination, -difficulty urinating, -blood in urine, -urinary frequency, -urgency, -incontinence Neurology: -headache, -weakness, -tingling,  -numbness, -memory loss, -falls, -dizziness Psychology: -depressed mood, -agitation, -sleep problems Male GU: no testicular mass, pain, no lymph nodes swollen, no swelling, no rash.     Objective:  BP 118/80   Pulse 73   Ht 5\' 7"  (1.702 m)   Wt 156 lb 3.2 oz (70.9 kg)   SpO2 97%   BMI 24.46 kg/m   General appearance: alert, no distress, WD/WN,  male Skin: Few scattered benign papular lesions on left neck, lower back, abdomen.  No worrisome lesions HEENT: normocephalic, conjunctiva/corneas normal, sclerae anicteric, PERRLA, EOMi, nares patent, no discharge or erythema, pharynx normal Oral cavity: MMM, tongue normal, teeth normal Neck: supple, no lymphadenopathy, no thyromegaly, no masses, normal ROM, no bruits Chest: non tender, normal shape and expansion Heart: RRR, normal S1, S2, no murmurs Lungs: CTA bilaterally, no wheezes, rhonchi, or rales Abdomen: +bs, soft, non tender, non distended, no masses, no hepatomegaly, no splenomegaly, no bruits Back: non tender, normal ROM, no scoliosis Musculoskeletal: upper extremities non tender, no obvious deformity, normal ROM throughout, lower extremities non tender, no obvious deformity, normal ROM throughout Extremities: no edema, no cyanosis, no clubbing Pulses: 2+ symmetric, upper and lower extremities, normal cap refill Neurological: alert, oriented x 3, CN2-12 intact, strength normal upper extremities and lower extremities, sensation normal throughout, DTRs 2+ throughout, no cerebellar signs, gait normal Psychiatric: normal affect, behavior normal, pleasant  GU: normal male external genitalia,circumcised, nontender, no masses, no hernia, no lymphadenopathy Rectal: Deferred   Assessment and Plan :   Encounter Diagnoses  Name Primary?  . Encounter for health maintenance examination in adult Yes  . Screening for lipid disorders     Physical exam - discussed and counseled on healthy lifestyle, diet, exercise, preventative care,  vaccinations, sick and well care, proper use of emergency dept and after hours care, and addressed their concerns.    Health screening: See your eye doctor yearly for routine vision care. See your dentist yearly for routine dental care including hygiene visits twice yearly.  Cancer screening Advised monthly self testicular exam  Advised Colonoscopy age 50yo, baseline PSA 45-50yo   Vaccinations: Advised yearly influenza vaccine  2017 sleep study showing mild sleep apnea.  Continue regular exercise.   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  Diagnoses and all orders for this visit:  Encounter for health maintenance examination in adult -     Lipid panel -     CBC -     Comprehensive metabolic panel  Screening for lipid disorders   Follow-up pending labs, yearly for physical

## 2019-12-24 ENCOUNTER — Ambulatory Visit: Payer: Self-pay | Attending: Internal Medicine

## 2019-12-24 DIAGNOSIS — Z23 Encounter for immunization: Secondary | ICD-10-CM

## 2019-12-24 NOTE — Progress Notes (Signed)
   Covid-19 Vaccination Clinic  Name:  Charles Bartlett    MRN: [id number] DOB: March 29, 1975  12/24/2019  Charles Bartlett was observed post Covid-19 immunization for 15 minutes without incident. He was provided with Vaccine Information Sheet and instruction to access the V-Safe system.   Charles Bartlett was instructed to call 911 with any severe reactions post vaccine: Marland Kitchen Difficulty breathing  . Swelling of face and throat  . A fast heartbeat  . A bad rash all over body  . Dizziness and weakness

## 2020-03-02 IMAGING — DX DG CHEST 1V
1 series · 1 of 1 positions shown · non-contrast
Comparison: 04/06/2017

CLINICAL DATA: Physical exam

EXAM:
CHEST 1 VIEW

[chest pa]
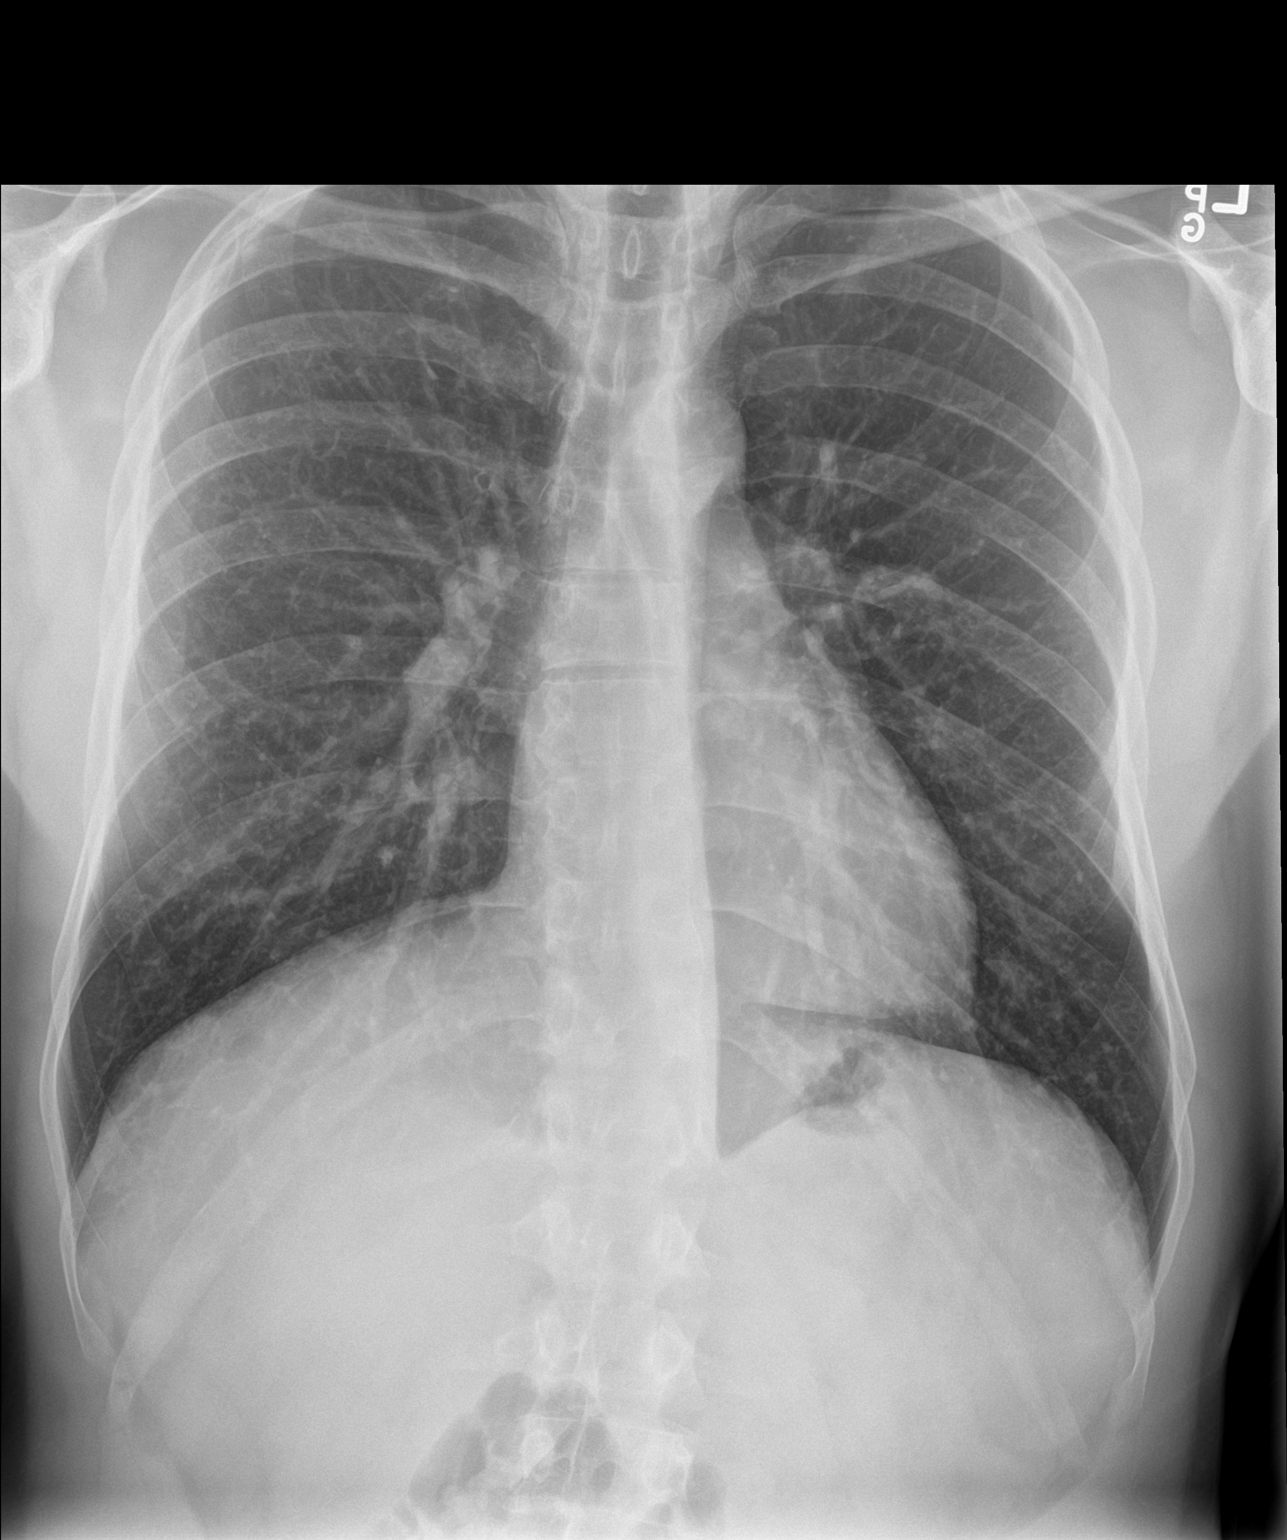

[1 of 1 positions shown; findings below may reference images not displayed]

FINDINGS: Heart and mediastinal contours are within normal limits. No focal
opacities or effusions. No acute bony abnormality.
IMPRESSION: No active disease.

## 2020-09-12 ENCOUNTER — Encounter: Payer: Self-pay | Admitting: Medical

## 2020-09-12 ENCOUNTER — Other Ambulatory Visit: Payer: Self-pay

## 2020-09-12 ENCOUNTER — Ambulatory Visit (INDEPENDENT_AMBULATORY_CARE_PROVIDER_SITE_OTHER): Payer: No Typology Code available for payment source | Admitting: Medical

## 2020-09-12 VITALS — BP 120/70 | HR 90 | Ht 68.0 in | Wt 150.0 lb

## 2020-09-12 DIAGNOSIS — Z0000 Encounter for general adult medical examination without abnormal findings: Secondary | ICD-10-CM | POA: Insufficient documentation

## 2020-09-12 DIAGNOSIS — Z1321 Encounter for screening for nutritional disorder: Secondary | ICD-10-CM

## 2020-09-12 DIAGNOSIS — R6882 Decreased libido: Secondary | ICD-10-CM | POA: Diagnosis not present

## 2020-09-12 DIAGNOSIS — Z13220 Encounter for screening for lipoid disorders: Secondary | ICD-10-CM | POA: Insufficient documentation

## 2020-09-12 DIAGNOSIS — Z1329 Encounter for screening for other suspected endocrine disorder: Secondary | ICD-10-CM | POA: Insufficient documentation

## 2020-09-12 NOTE — Progress Notes (Signed)
Subjective:   HPI  Charles Bartlett is a 45 y.o. male who presents for Chief Complaint  Patient presents with   cpe    Nonfasting cpe. No concerns    Patient Care Team: Ramah Langhans, Kermit Balo, PA-C as PCP - General (Family Medicine) Sees dentist Sees eye doctor  Concerns: Was using some creatinine supplement but stopped.  Wanted opinion on this.  Wants hormones checked.  Sometimes wonders about libido, hormone.    Reviewed their medical, surgical, family, social, medication, and allergy history and updated chart as appropriate.  Past Medical History:  Diagnosis Date   H/O echocardiogram 04/2017   mild LVH, Dr. Jacinto Halim; eval due to chest discomfort   Wears contact lenses     Past Surgical History:  Procedure Laterality Date   WISDOM TOOTH EXTRACTION      Family History  Problem Relation Age of Onset   Other Mother        old age   Cancer Neg Hx    Heart disease Neg Hx    Stroke Neg Hx    Diabetes Neg Hx      Current Outpatient Medications:    Multiple Vitamin (MULTIVITAMIN WITH MINERALS) TABS, Take 1 tablet by mouth daily., Disp: , Rfl:    Omega-3 Fatty Acids (SUPER OMEGA 3) 500 MG CAPS, Take 1 capsule by mouth daily., Disp: , Rfl:   Allergies  Allergen Reactions   Gelatin     sensitivity     Review of Systems Constitutional: -fever, -chills, -sweats, -unexpected weight change, -decreased appetite, -fatigue Allergy: -sneezing, -itching, -congestion Dermatology: -changing moles, --rash, -lumps ENT: -runny nose, -ear pain, -sore throat, -hoarseness, -sinus pain, -teeth pain, - ringing in ears, -hearing loss, -nosebleeds Cardiology: -chest pain, -palpitations, -swelling, -difficulty breathing when lying flat, -waking up short of breath Respiratory: -cough, -shortness of breath, -difficulty breathing with exercise or exertion, -wheezing, -coughing up blood Gastroenterology: -abdominal pain, -nausea, -vomiting, -diarrhea, -constipation, -blood in stool, -changes in  bowel movement, -difficulty swallowing or eating Hematology: -bleeding, -bruising  Musculoskeletal: -joint aches, -muscle aches, -joint swelling, -back pain, -neck pain, -cramping, -changes in gait Ophthalmology: denies vision changes, eye redness, itching, discharge Urology: -burning with urination, -difficulty urinating, -blood in urine, -urinary frequency, -urgency, -incontinence Neurology: -headache, -weakness, -tingling, -numbness, -memory loss, -falls, -dizziness Psychology: -depressed mood, -agitation, -sleep problems Male GU: no testicular mass, pain, no lymph nodes swollen, no swelling, no rash.  Depression screen [facility] 2/9 09/12/2020 08/22/2019 08/19/2018 08/17/2017  Decreased Interest 0 0 0 0  Down, Depressed, Hopeless 0 0 0 0  PHQ - 2 Score 0 0 0 0        Objective:  BP 120/70   Pulse 90   Ht 5\' 8"  (1.727 m)   Wt 150 lb (68 kg)   BMI 22.81 kg/m   General appearance: alert, no distress, WD/WN, Caucasian male Skin: scattered macules, no worrisome lesions HEENT: normocephalic, conjunctiva/corneas normal, sclerae anicteric, PERRLA, EOMi, nares patent, no discharge or erythema, pharynx normal Oral cavity: MMM, tongue normal, teeth normal Neck: supple, no lymphadenopathy, no thyromegaly, no masses, normal ROM, no bruits Chest: non tender, normal shape and expansion Heart: RRR, normal S1, S2, no murmurs Lungs: CTA bilaterally, no wheezes, rhonchi, or rales Abdomen: +bs, soft, non tender, non distended, no masses, no hepatomegaly, no splenomegaly, no bruits Back: non tender, normal ROM, no scoliosis Musculoskeletal: upper extremities non tender, no obvious deformity, normal ROM throughout, lower extremities non tender, no obvious deformity, normal ROM throughout Extremities: no edema, no cyanosis,  no clubbing Pulses: 2+ symmetric, upper and lower extremities, normal cap refill Neurological: alert, oriented x 3, CN2-12 intact, strength normal upper extremities and lower  extremities, sensation normal throughout, DTRs 2+ throughout, no cerebellar signs, gait normal Psychiatric: normal affect, behavior normal, pleasant  GU: normal male external genitalia, nontender, no masses, no hernia, no lymphadenopathy Rectal: deferred   Assessment and Plan :   Encounter Diagnoses  Name Primary?   Encounter for health maintenance examination in adult Yes   Libido, decreased    Screening for thyroid disorder    Screening for lipid disorders    Encounter for vitamin deficiency screening     This visit was a preventative care visit, also known as wellness visit or routine physical.   Topics typically include healthy lifestyle, diet, exercise, preventative care, vaccinations, sick and well care, proper use of emergency dept and after hours care, as well as other concerns.     Recommendations: Continue to return yearly for your annual wellness and preventative care visits.  This gives [place] a chance to discuss healthy lifestyle, exercise, vaccinations, review your chart record, and perform screenings where appropriate.  I recommend you see your eye doctor yearly for routine vision care.  I recommend you see your dentist yearly for routine dental care including hygiene visits twice yearly.   Vaccination recommendations were reviewed Immunization History  Administered Date(s) Administered   Hepatitis A, Adult 08/17/2017, 03/10/2018   Influenza,inj,Quad PF,6+ Mos 02/07/2013   Meningococcal Conjugate 02/07/2013   PFIZER(Purple Top)SARS-COV-2 Vaccination 05/05/2019, 05/30/2019, 12/24/2019   Tdap 08/17/2017    Advised yearly flu shot   Screening for cancer: Colon cancer screening: Age 70  Testicular cancer screening You should do a monthly self testicular exam if you are between 40-36 years old  We discussed PSA, prostate exam, and prostate cancer screening risks/benefits.   Age 35  Skin cancer screening: Check your skin regularly for new changes, growing  lesions, or other lesions of concern Come in for evaluation if you have skin lesions of concern.  Lung cancer screening: If you have a greater than 20 pack year history of tobacco use, then you may qualify for lung cancer screening with a chest CT scan.   Please call your insurance company to inquire about coverage for this test.  We currently don't have screenings for other cancers besides breast, cervical, colon, and lung cancers.  If you have a strong family history of cancer or have other cancer screening concerns, please let me know.    Bone health: Get at least 150 minutes of aerobic exercise weekly Get weight bearing exercise at least once weekly Bone density test:  A bone density test is an imaging test that uses a type of X-ray to measure the amount of calcium and other minerals in your bones. The test may be used to diagnose or screen you for a condition that causes weak or thin bones (osteoporosis), predict your risk for a broken bone (fracture), or determine how well your osteoporosis treatment is working. The bone density test is recommended for females 65 and older, or females or males <65 if certain risk factors such as thyroid disease, long term use of steroids such as for asthma or rheumatological issues, vitamin D deficiency, estrogen deficiency, family history of osteoporosis, self or family history of fragility fracture in first degree relative.    Heart health: Get at least 150 minutes of aerobic exercise weekly Limit alcohol It is important to maintain a healthy blood pressure and healthy  cholesterol numbers  Heart disease screening: Screening for heart disease includes screening for blood pressure, fasting lipids, glucose/diabetes screening, BMI height to weight ratio, reviewed of smoking status, physical activity, and diet.    Goals include blood pressure 120/80 or less, maintaining a healthy lipid/cholesterol profile, preventing diabetes or keeping diabetes numbers  under good control, not smoking or using tobacco products, exercising most days per week or at least 150 minutes per week of exercise, and eating healthy variety of fruits and vegetables, healthy oils, and avoiding unhealthy food choices like fried food, fast food, high sugar and high cholesterol foods.    Other tests may possibly include EKG test, CT coronary calcium score, echocardiogram, exercise treadmill stress test.     Medical care options: I recommend you continue to seek care here first for routine care.  We try really hard to have available appointments Monday through Friday daytime hours for sick visits, acute visits, and physicals.  Urgent care should be used for after hours and weekends for significant issues that cannot wait till the next day.  The emergency department should be used for significant potentially life-threatening emergencies.  The emergency department is expensive, can often have long wait times for less significant concerns, so try to utilize primary care, urgent care, or telemedicine when possible to avoid unnecessary trips to the emergency department.  Virtual visits and telemedicine have been introduced since the pandemic started in 2020, and can be convenient ways to receive medical care.  We offer virtual appointments as well to assist you in a variety of options to seek medical care.    Separate significant issues discussed: Hormone screening today     Jaaziel was seen today for cpe.  Diagnoses and all orders for this visit:  Encounter for health maintenance examination in adult -     Comprehensive metabolic panel -     Lipid panel -     CBC -     Testosterone -     TSH -     VITAMIN D 25 Hydroxy (Vit-D Deficiency, Fractures)  Libido, decreased -     Testosterone  Screening for thyroid disorder -     TSH  Screening for lipid disorders -     Lipid panel  Encounter for vitamin deficiency screening -     VITAMIN D 25 Hydroxy (Vit-D Deficiency,  Fractures)    Follow-up pending labs, yearly for physical

## 2020-09-13 LAB — COMPREHENSIVE METABOLIC PANEL
ALT: 24 IU/L (ref 0–44)
AST: 30 IU/L (ref 0–40)
Albumin/Globulin Ratio: 1.7 (ref 1.2–2.2)
Albumin: 4.5 g/dL (ref 4.0–5.0)
Alkaline Phosphatase: 63 IU/L (ref 44–121)
BUN/Creatinine Ratio: 12 (ref 9–20)
BUN: 14 mg/dL (ref 6–24)
Bilirubin Total: 0.3 mg/dL (ref 0.0–1.2)
CO2: 23 mmol/L (ref 20–29)
Calcium: 9.5 mg/dL (ref 8.7–10.2)
Chloride: 101 mmol/L (ref 96–106)
Creatinine, Ser: 1.14 mg/dL (ref 0.76–1.27)
Globulin, Total: 2.6 g/dL (ref 1.5–4.5)
Glucose: 77 mg/dL (ref 65–99)
Potassium: 4.4 mmol/L (ref 3.5–5.2)
Sodium: 138 mmol/L (ref 134–144)
Total Protein: 7.1 g/dL (ref 6.0–8.5)
eGFR: 82 mL/min/{1.73_m2} (ref >59)

## 2020-09-13 LAB — CBC
Hematocrit: 45.9 % (ref 37.5–51.0)
Hemoglobin: 14.9 g/dL (ref 13.0–17.7)
MCH: 28.8 pg (ref 26.6–33.0)
MCHC: 32.5 g/dL (ref 31.5–35.7)
MCV: 89 fL (ref 79–97)
Platelets: 198 10*3/uL (ref 150–450)
RBC: 5.18 x10E6/uL (ref 4.14–5.80)
RDW: 13.8 % (ref 11.6–15.4)
WBC: 4.7 10*3/uL (ref 3.4–10.8)

## 2020-09-13 LAB — LIPID PANEL
Chol/HDL Ratio: 2.8 ratio (ref 0.0–5.0)
Cholesterol, Total: 181 mg/dL (ref 100–199)
HDL: 64 mg/dL (ref >39)
LDL Chol Calc (NIH): 103 mg/dL — ABNORMAL HIGH (ref 0–99)
Triglycerides: 76 mg/dL (ref 0–149)
VLDL Cholesterol Cal: 14 mg/dL (ref 5–40)

## 2020-09-13 LAB — TESTOSTERONE: Testosterone: 713 ng/dL (ref 264–916)

## 2020-09-13 LAB — TSH: TSH: 1.05 u[IU]/mL (ref 0.450–4.500)

## 2020-09-13 LAB — VITAMIN D 25 HYDROXY (VIT D DEFICIENCY, FRACTURES): Vit D, 25-Hydroxy: 32 ng/mL (ref 30.0–100.0)

## 2021-04-18 DIAGNOSIS — H18711 Corneal ectasia, right eye: Secondary | ICD-10-CM | POA: Diagnosis not present

## 2021-07-10 ENCOUNTER — Ambulatory Visit: Payer: BC Managed Care – PPO | Admitting: Medical

## 2021-07-10 VITALS — BP 130/78 | HR 68 | Temp 98.0°F | Wt 151.2 lb

## 2021-07-10 DIAGNOSIS — Z113 Encounter for screening for infections with a predominantly sexual mode of transmission: Secondary | ICD-10-CM | POA: Diagnosis not present

## 2021-07-10 DIAGNOSIS — L989 Disorder of the skin and subcutaneous tissue, unspecified: Secondary | ICD-10-CM

## 2021-07-10 DIAGNOSIS — L80 Vitiligo: Secondary | ICD-10-CM

## 2021-07-10 DIAGNOSIS — E559 Vitamin D deficiency, unspecified: Secondary | ICD-10-CM

## 2021-07-10 MED ORDER — VITAMIN D3 50 MCG (2000 UT) PO CAPS: 2000.0000 [IU] | ORAL_CAPSULE | Freq: Every day | ORAL | 0 refills | Status: DC

## 2021-07-10 NOTE — Progress Notes (Signed)
Subjective: ? Charles Bartlett is a 46 y.o. male who presents for ?Chief Complaint  ?Patient presents with  ? white spot  ?  White spot on left testicles- not growing not painful within the last couple weeks until 2 days ago. Pain level 2-3  ?   ?Here for concern of skin lesion of the left testicle area.  Just noticed this recently about 4 weeks ago.  No itching, no redness, no burning, no pain.   No nodule or lumps.  Was concerned.  He notes discomfort but thinks he we just overreacting to seeing the spot ? ?Wants routine STD screen today.   ? ?No other aggravating or relieving factors.   ? ?No other c/o. ? ?The following portions of the patient's history were reviewed and updated as appropriate: allergies, current medications, past family history, past medical history, past social history, past surgical history and problem list. ? ?ROS ?Otherwise as in subjective above ? ?Objective: ?BP 130/78   Pulse 68   Temp 98 ?F (36.7 ?C)   Wt 151 lb 3.2 oz (68.6 kg)   BMI 22.99 kg/m?  ? ?General appearance: alert, no distress, well developed, well nourished ?GU: normal male, circumcised, no nodule, no lymphadenopathy, no mass.  There is a 1cm somewhat oval area of hypopigmentation.  Nontender, no rough skin, no other skin lesion ? ? ?Assessment: ?Encounter Diagnoses  ?Name Primary?  ? Vitamin D deficiency Yes  ? Screen for STD (sexually transmitted disease)   ? Skin lesion   ? Vitiligo   ? ? ? ?Plan: ?We discussed skin concern that suggests vitiligo.  We discussed differential.  Reassured no worrisome finding. We discussed no specific cure.  Can try Vitamin E cream and restart vitamin D supplement.  Offered dermatology consult but he declines for now. ? ?Begin vitamin D supplement.  Get sun exposure and continue to eat plenty of fish ? ?Routine screening for STDs request ? ?Tibor was seen today for white spot. ? ?Diagnoses and all orders for this visit: ? ?Vitamin D deficiency ? ?Screen for STD (sexually transmitted  disease) ?-     RPR+HIV+GC+CT Panel ? ?Skin lesion ? ?Vitiligo ? ?Other orders ?-     Cholecalciferol (VITAMIN D3) 50 MCG (2000 UT) capsule; Take 1 capsule (2,000 Units total) by mouth daily. ? ? ?Follow up: pending labs ?

## 2021-07-12 LAB — RPR+HIV+GC+CT PANEL
Chlamydia trachomatis, NAA: NEGATIVE
HIV Screen 4th Generation wRfx: NONREACTIVE
Neisseria Gonorrhoeae by PCR: NEGATIVE
RPR Ser Ql: NONREACTIVE

## 2021-09-03 ENCOUNTER — Encounter: Payer: Self-pay | Admitting: Medical

## 2021-09-03 ENCOUNTER — Ambulatory Visit: Payer: BC Managed Care – PPO | Admitting: Medical

## 2021-09-03 ENCOUNTER — Other Ambulatory Visit: Payer: Self-pay

## 2021-09-03 VITALS — BP 120/80 | HR 84 | Wt 156.6 lb

## 2021-09-03 DIAGNOSIS — Z0000 Encounter for general adult medical examination without abnormal findings: Secondary | ICD-10-CM | POA: Diagnosis not present

## 2021-09-03 DIAGNOSIS — N529 Male erectile dysfunction, unspecified: Secondary | ICD-10-CM | POA: Diagnosis not present

## 2021-09-03 DIAGNOSIS — Z136 Encounter for screening for cardiovascular disorders: Secondary | ICD-10-CM

## 2021-09-03 DIAGNOSIS — Z1211 Encounter for screening for malignant neoplasm of colon: Secondary | ICD-10-CM

## 2021-09-03 DIAGNOSIS — Z13220 Encounter for screening for lipoid disorders: Secondary | ICD-10-CM | POA: Diagnosis not present

## 2021-09-03 LAB — POCT URINALYSIS DIP (PROADVANTAGE DEVICE)
Bilirubin, UA: NEGATIVE
Blood, UA: NEGATIVE
Glucose, UA: NEGATIVE mg/dL
Ketones, POC UA: NEGATIVE mg/dL
Leukocytes, UA: NEGATIVE
Nitrite, UA: NEGATIVE
Protein Ur, POC: NEGATIVE mg/dL
Specific Gravity, Urine: 1.01
Urobilinogen, Ur: 0.2
pH, UA: 7.5 (ref 5.0–8.0)

## 2021-09-03 MED ORDER — SILDENAFIL CITRATE 100 MG PO TABS: 50.0000 mg | ORAL_TABLET | Freq: Every day | ORAL | 2 refills | Status: DC | PRN

## 2021-09-03 NOTE — Progress Notes (Signed)
Subjective:   HPI  Charles Bartlett is a 46 y.o. male who presents for Chief Complaint  Patient presents with   Annual Exam    None fasting CPE.    Patient Care Team: Sophea Rackham, Cleda Mccreedy as PCP - General (Family Medicine) Sees dentist Sees eye doctor  Concerns: Nonfasting today.   About 3 hours ago had a protein shake and banana  Having some ED concerns.  Can get some erections but they don't always stay full. Gets some morning erections but not as frequently as in the past.  Has one partner, girlfriend currently.   No concern for STD.    Reviewed their medical, surgical, family, social, medication, and allergy history and updated chart as appropriate.  Past Medical History:  Diagnosis Date   H/O echocardiogram 04/2017   mild LVH, Dr. Jacinto Halim; eval due to chest discomfort   Wears contact lenses     Past Surgical History:  Procedure Laterality Date   WISDOM TOOTH EXTRACTION      Family History  Problem Relation Age of Onset   Other Mother        old age   Cancer Neg Hx    Heart disease Neg Hx    Stroke Neg Hx    Diabetes Neg Hx      Current Outpatient Medications:    Cholecalciferol (VITAMIN D3) 50 MCG (2000 UT) capsule, Take 1 capsule (2,000 Units total) by mouth daily., Disp: 90 capsule, Rfl: 0   Multiple Vitamin (MULTIVITAMIN WITH MINERALS) TABS, Take 1 tablet by mouth daily., Disp: , Rfl:    Omega-3 Fatty Acids (SUPER OMEGA 3) 500 MG CAPS, Take 1 capsule by mouth daily., Disp: , Rfl:    sildenafil (VIAGRA) 100 MG tablet, Take 0.5-1 tablets (50-100 mg total) by mouth daily as needed for erectile dysfunction., Disp: 10 tablet, Rfl: 2  Allergies  Allergen Reactions   Gelatin     sensitivity     Review of Systems Constitutional: -fever, -chills, -sweats, -unexpected weight change, -decreased appetite, -fatigue Allergy: -sneezing, -itching, -congestion Dermatology: -changing moles, --rash, -lumps ENT: -runny nose, -ear pain, -sore throat, -hoarseness,  -sinus pain, -teeth pain, - ringing in ears, -hearing loss, -nosebleeds Cardiology: -chest pain, -palpitations, -swelling, -difficulty breathing when lying flat, -waking up short of breath Respiratory: -cough, -shortness of breath, -difficulty breathing with exercise or exertion, -wheezing, -coughing up blood Gastroenterology: -abdominal pain, -nausea, -vomiting, -diarrhea, -constipation, -blood in stool, -changes in bowel movement, -difficulty swallowing or eating Hematology: -bleeding, -bruising  Musculoskeletal: -joint aches, -muscle aches, -joint swelling, -back pain, -neck pain, -cramping, -changes in gait Ophthalmology: denies vision changes, eye redness, itching, discharge Urology: -burning with urination, -difficulty urinating, -blood in urine, -urinary frequency, -urgency, -incontinence Neurology: -headache, -weakness, -tingling, -numbness, -memory loss, -falls, -dizziness Psychology: -depressed mood, -agitation, -sleep problems Male GU: no testicular mass, pain, no lymph nodes swollen, no swelling, no rash.     09/03/2021    8:42 AM 07/10/2021    2:43 PM 09/12/2020    8:26 AM 08/22/2019    8:38 AM 08/19/2018    8:36 AM  Depression screen PHQ 2/9  Decreased Interest 0 0 0 0 0  Down, Depressed, Hopeless 0 0 0 0 0  PHQ - 2 Score 0 0 0 0 0        Objective:  BP 120/80   Pulse 84   Wt 156 lb 9.6 oz (71 kg)   SpO2 97%   BMI 23.81 kg/m   General appearance: alert, no distress,  WD/WN, Caucasian male Skin: no new worrisome lesions HEENT: normocephalic, conjunctiva/corneas normal, sclerae anicteric, PERRLA, EOMi, nares patent, no discharge or erythema, pharynx normal Oral cavity: MMM, tongue normal, teeth in good repair Neck: supple, no lymphadenopathy, no thyromegaly, no masses, normal ROM, no bruits Chest: non tender, normal shape and expansion Heart: RRR, normal S1, S2, no murmurs Lungs: CTA bilaterally, no wheezes, rhonchi, or rales Abdomen: +bs, soft, non tender, non  distended, no masses, no hepatomegaly, no splenomegaly, no bruits Back: non tender, normal ROM, no scoliosis Musculoskeletal: upper extremities non tender, no obvious deformity, normal ROM throughout, lower extremities non tender, no obvious deformity, normal ROM throughout Extremities: no edema, no cyanosis, no clubbing Pulses: 2+ symmetric, upper and lower extremities, normal cap refill Neurological: alert, oriented x 3, CN2-12 intact, strength normal upper extremities and lower extremities, sensation normal throughout, DTRs 2+ throughout, no cerebellar signs, gait normal Psychiatric: normal affect, behavior normal, pleasant  GU: normal male external genitalia,circumcised, nontender, no masses, no hernia, no lymphadenopathy Rectal: deferred  EKG reviewed   Assessment and Plan :   Encounter Diagnoses  Name Primary?   Encounter for health maintenance examination in adult Yes   Screening for heart disease    Erectile dysfunction, unspecified erectile dysfunction type    Screen for colon cancer     This visit was a preventative care visit, also known as wellness visit or routine physical.   Topics typically include healthy lifestyle, diet, exercise, preventative care, vaccinations, sick and well care, proper use of emergency dept and after hours care, as well as other concerns.     Recommendations: Continue to return yearly for your annual wellness and preventative care visits.  This gives [place] a chance to discuss healthy lifestyle, exercise, vaccinations, review your chart record, and perform screenings where appropriate.  I recommend you see your eye doctor yearly for routine vision care.  I recommend you see your dentist yearly for routine dental care including hygiene visits twice yearly.   Vaccination recommendations were reviewed Immunization History  Administered Date(s) Administered   Hepatitis A, Adult 08/17/2017, 03/10/2018   Influenza,inj,Quad PF,6+ Mos 02/07/2013    Meningococcal Conjugate 02/07/2013   PFIZER(Purple Top)SARS-COV-2 Vaccination 05/05/2019, 05/30/2019, 12/24/2019   Tdap 08/17/2017     Screening for cancer: Colon cancer screening: You will be due at age 45  Please call your insurance company to check coverage for colon cancer screening.  Options may include Cologard stool test or Colonoscopy.  You should also inquire about which facility the colonoscopy could be performed, and coverage for diagnostic vs screening colonoscopy as coverage may vary.  If you have significant family history of colon cancer or blood in the stool, then you should only do the colonoscopy, not the cologard test.   Testicular cancer screening You should do a monthly self testicular exam if you are between 54-79 years old  We discussed PSA, prostate exam, and prostate cancer screening risks/benefits.     Skin cancer screening: Check your skin regularly for new changes, growing lesions, or other lesions of concern Come in for evaluation if you have skin lesions of concern.  Lung cancer screening: If you have a greater than 20 pack year history of tobacco use, then you may qualify for lung cancer screening with a chest CT scan.   Please call your insurance company to inquire about coverage for this test.  We currently don't have screenings for other cancers besides breast, cervical, colon, and lung cancers.  If you have a strong  family history of cancer or have other cancer screening concerns, please let me know.    Bone health: Get at least 150 minutes of aerobic exercise weekly Get weight bearing exercise at least once weekly Bone density test:  A bone density test is an imaging test that uses a type of X-ray to measure the amount of calcium and other minerals in your bones. The test may be used to diagnose or screen you for a condition that causes weak or thin bones (osteoporosis), predict your risk for a broken bone (fracture), or determine how well your  osteoporosis treatment is working. The bone density test is recommended for females 65 and older, or females or males <65 if certain risk factors such as thyroid disease, long term use of steroids such as for asthma or rheumatological issues, vitamin D deficiency, estrogen deficiency, family history of osteoporosis, self or family history of fragility fracture in first degree relative.    Heart health: Get at least 150 minutes of aerobic exercise weekly Limit alcohol It is important to maintain a healthy blood pressure and healthy cholesterol numbers  Heart disease screening: Screening for heart disease includes screening for blood pressure, fasting lipids, glucose/diabetes screening, BMI height to weight ratio, reviewed of smoking status, physical activity, and diet.    Goals include blood pressure 120/80 or less, maintaining a healthy lipid/cholesterol profile, preventing diabetes or keeping diabetes numbers under good control, not smoking or using tobacco products, exercising most days per week or at least 150 minutes per week of exercise, and eating healthy variety of fruits and vegetables, healthy oils, and avoiding unhealthy food choices like fried food, fast food, high sugar and high cholesterol foods.    Other tests may possibly include EKG test, CT coronary calcium score, echocardiogram, exercise treadmill stress test.   Reviewed 2019 Echocardiogram showing mild LVH   Medical care options: I recommend you continue to seek care here first for routine care.  We try really hard to have available appointments Monday through Friday daytime hours for sick visits, acute visits, and physicals.  Urgent care should be used for after hours and weekends for significant issues that cannot wait till the next day.  The emergency department should be used for significant potentially life-threatening emergencies.  The emergency department is expensive, can often have long wait times for less significant  concerns, so try to utilize primary care, urgent care, or telemedicine when possible to avoid unnecessary trips to the emergency department.  Virtual visits and telemedicine have been introduced since the pandemic started in 2020, and can be convenient ways to receive medical care.  We offer virtual appointments as well to assist you in a variety of options to seek medical care.   Advanced Directives: I recommend you consider completing a Health Care Power of Attorney and Living Will.   These documents respect your wishes and help alleviate burdens on your loved ones if you were to become terminally ill or be in a position to need those documents enforced.    You can complete Advanced Directives yourself, have them notarized, then have copies made for our office, for you and for anybody you feel should have them in safe keeping.  Or, you can have an attorney prepare these documents.   If you haven't updated your Last Will and Testament in a while, it may be worthwhile having an attorney prepare these documents together and save on some costs.       Separate significant issues discussed: Erectile dysfunction -  we discussed concerns, symptoms and possible treatments.   Can use trial of Viagra.   Discussed risks/benefits and proper use.   Charles Bartlett was seen today for annual exam.  Diagnoses and all orders for this visit:  Encounter for health maintenance examination in adult -     Comprehensive metabolic panel -     CBC -     Lipid panel -     EKG 12-Lead -     POCT Urinalysis DIP (Proadvantage Device)  Screening for heart disease -     EKG 12-Lead  Erectile dysfunction, unspecified erectile dysfunction type -     EKG 12-Lead  Screen for colon cancer  Other orders -     sildenafil (VIAGRA) 100 MG tablet; Take 0.5-1 tablets (50-100 mg total) by mouth daily as needed for erectile dysfunction.   Follow-up pending labs, yearly for physical

## 2021-09-04 LAB — COMPREHENSIVE METABOLIC PANEL
ALT: 33 IU/L (ref 0–44)
AST: 34 IU/L (ref 0–40)
Albumin/Globulin Ratio: 1.8 (ref 1.2–2.2)
Albumin: 4.8 g/dL (ref 4.1–5.1)
Alkaline Phosphatase: 66 IU/L (ref 44–121)
BUN/Creatinine Ratio: 14 (ref 9–20)
BUN: 15 mg/dL (ref 6–24)
Bilirubin Total: 0.4 mg/dL (ref 0.0–1.2)
CO2: 25 mmol/L (ref 20–29)
Calcium: 10.1 mg/dL (ref 8.7–10.2)
Chloride: 101 mmol/L (ref 96–106)
Creatinine, Ser: 1.09 mg/dL (ref 0.76–1.27)
Globulin, Total: 2.7 g/dL (ref 1.5–4.5)
Glucose: 83 mg/dL (ref 70–99)
Potassium: 4.6 mmol/L (ref 3.5–5.2)
Sodium: 145 mmol/L — ABNORMAL HIGH (ref 134–144)
Total Protein: 7.5 g/dL (ref 6.0–8.5)
eGFR: 86 mL/min/{1.73_m2} (ref >59)

## 2021-09-04 LAB — LIPID PANEL
Chol/HDL Ratio: 3.1 ratio (ref 0.0–5.0)
Cholesterol, Total: 201 mg/dL — ABNORMAL HIGH (ref 100–199)
HDL: 65 mg/dL (ref >39)
LDL Chol Calc (NIH): 119 mg/dL — ABNORMAL HIGH (ref 0–99)
Triglycerides: 98 mg/dL (ref 0–149)
VLDL Cholesterol Cal: 17 mg/dL (ref 5–40)

## 2021-09-04 LAB — CBC
Hematocrit: 47.3 % (ref 37.5–51.0)
Hemoglobin: 15.8 g/dL (ref 13.0–17.7)
MCH: 29.8 pg (ref 26.6–33.0)
MCHC: 33.4 g/dL (ref 31.5–35.7)
MCV: 89 fL (ref 79–97)
Platelets: 205 10*3/uL (ref 150–450)
RBC: 5.31 x10E6/uL (ref 4.14–5.80)
RDW: 13 % (ref 11.6–15.4)
WBC: 6.5 10*3/uL (ref 3.4–10.8)

## 2021-09-13 ENCOUNTER — Encounter: Payer: No Typology Code available for payment source | Admitting: Medical

## 2021-10-13 ENCOUNTER — Other Ambulatory Visit: Payer: Self-pay | Admitting: Medical

## 2021-10-30 ENCOUNTER — Encounter: Payer: Self-pay | Admitting: Internal Medicine

## 2021-12-03 ENCOUNTER — Encounter: Payer: Self-pay | Admitting: Internal Medicine

## 2021-12-17 DIAGNOSIS — M9905 Segmental and somatic dysfunction of pelvic region: Secondary | ICD-10-CM | POA: Diagnosis not present

## 2021-12-17 DIAGNOSIS — M5137 Other intervertebral disc degeneration, lumbosacral region: Secondary | ICD-10-CM | POA: Diagnosis not present

## 2021-12-17 DIAGNOSIS — M9903 Segmental and somatic dysfunction of lumbar region: Secondary | ICD-10-CM | POA: Diagnosis not present

## 2021-12-17 DIAGNOSIS — M25551 Pain in right hip: Secondary | ICD-10-CM | POA: Diagnosis not present

## 2022-01-01 DIAGNOSIS — M47819 Spondylosis without myelopathy or radiculopathy, site unspecified: Secondary | ICD-10-CM | POA: Diagnosis not present

## 2022-01-15 ENCOUNTER — Other Ambulatory Visit: Payer: Self-pay | Admitting: Medical

## 2022-07-15 ENCOUNTER — Telehealth: Payer: Self-pay | Admitting: Medical

## 2022-07-15 DIAGNOSIS — Z1211 Encounter for screening for malignant neoplasm of colon: Secondary | ICD-10-CM

## 2022-07-15 NOTE — Telephone Encounter (Signed)
Pt called and wants to know will he need to come in for an appointment to get a referral for a colonoscopy?

## 2022-07-15 NOTE — Telephone Encounter (Signed)
Pt has an appt in July for CPE. Are you ok with me going ahead to send in a referral to Colonoscopy

## 2022-07-15 NOTE — Telephone Encounter (Signed)
Left message for pt that referral has been placed.

## 2022-09-02 ENCOUNTER — Encounter: Payer: Self-pay | Admitting: Gastroenterology

## 2022-09-05 ENCOUNTER — Ambulatory Visit: Payer: BC Managed Care – PPO | Admitting: Medical

## 2022-09-05 VITALS — BP 120/80 | HR 97 | Ht 66.75 in | Wt 158.4 lb

## 2022-09-05 DIAGNOSIS — N529 Male erectile dysfunction, unspecified: Secondary | ICD-10-CM | POA: Diagnosis not present

## 2022-09-05 DIAGNOSIS — Z113 Encounter for screening for infections with a predominantly sexual mode of transmission: Secondary | ICD-10-CM

## 2022-09-05 DIAGNOSIS — Z13220 Encounter for screening for lipoid disorders: Secondary | ICD-10-CM

## 2022-09-05 DIAGNOSIS — Z0000 Encounter for general adult medical examination without abnormal findings: Secondary | ICD-10-CM

## 2022-09-05 LAB — POCT URINALYSIS DIP (PROADVANTAGE DEVICE)
Bilirubin, UA: NEGATIVE
Blood, UA: NEGATIVE
Glucose, UA: NEGATIVE mg/dL
Ketones, POC UA: NEGATIVE mg/dL
Leukocytes, UA: NEGATIVE
Nitrite, UA: NEGATIVE
Protein Ur, POC: NEGATIVE mg/dL
Specific Gravity, Urine: 1.01
Urobilinogen, Ur: NEGATIVE
pH, UA: 6 (ref 5.0–8.0)

## 2022-09-05 MED ORDER — SILDENAFIL CITRATE 100 MG PO TABS: 50.0000 mg | ORAL_TABLET | Freq: Every day | ORAL | 5 refills | Status: DC | PRN

## 2022-09-05 NOTE — [facility] Notes (Signed)
Subjective:   HPI  Charles Bartlett is a 47 y.o. male who presents for Chief Complaint  Patient presents with   Annual Exam    Nonfasting cpe, no concerns   Patient Care Team: Crayton Savarese, Kermit Balo, PA-C as PCP - General (Family Medicine) Sees dentist Sees eye doctor  Concerns: Having colonoscopy soon, scheduled for consult in a few weeks  Currently using B12 and creatinine supplements  Wants skin looked at, has new spots on neck  Exercise - weights, cardio, 6 days per week, 50 min  typically   Reviewed their medical, surgical, family, social, medication, and allergy history and updated chart as appropriate.  Past Medical History:  Diagnosis Date   H/O echocardiogram 04/2017   mild LVH, Dr. Jacinto Halim; eval due to chest discomfort   Wears contact lenses     Past Surgical History:  Procedure Laterality Date   WISDOM TOOTH EXTRACTION      Family History  Problem Relation Age of Onset   Other Mother        old age   Cancer Neg Hx    Heart disease Neg Hx    Stroke Neg Hx    Diabetes Neg Hx      Current Outpatient Medications:    Cholecalciferol (VITAMIN D3) 50 MCG (2000 UT) capsule, TAKE 1 CAPSULE BY MOUTH EVERY DAY, Disp: 30 capsule, Rfl: 2   Multiple Vitamin (MULTIVITAMIN WITH MINERALS) TABS, Take 1 tablet by mouth daily., Disp: , Rfl:    Omega-3 Fatty Acids (SUPER OMEGA 3) 500 MG CAPS, Take 1 capsule by mouth daily., Disp: , Rfl:    Creatine Monohydrate 1.5 GM/5ML LIQD, , Disp: , Rfl:    Cyanocobalamin (B-12) 1000 MCG CAPS, , Disp: , Rfl:    sildenafil (VIAGRA) 100 MG tablet, Take 0.5-1 tablets (50-100 mg total) by mouth daily as needed for erectile dysfunction., Disp: 10 tablet, Rfl: 5  Allergies  Allergen Reactions   Gelatin     sensitivity    Review of Systems  Constitutional:  Negative for chills, fever, malaise/fatigue and weight loss.  HENT:  Negative for congestion, ear pain, hearing loss, sore throat and tinnitus.   Eyes:  Negative for blurred vision,  pain and redness.  Respiratory:  Negative for cough, hemoptysis and shortness of breath.   Cardiovascular:  Negative for chest pain, palpitations, orthopnea, claudication and leg swelling.  Gastrointestinal:  Negative for abdominal pain, blood in stool, constipation, diarrhea, nausea and vomiting.  Genitourinary:  Negative for dysuria, flank pain, frequency, hematuria and urgency.  Musculoskeletal:  Negative for falls, joint pain and myalgias.  Skin:  Negative for itching and rash.  Neurological:  Negative for dizziness, tingling, speech change, weakness and headaches.  Endo/Heme/Allergies:  Negative for polydipsia. Does not bruise/bleed easily.  Psychiatric/Behavioral:  Negative for depression and memory loss. The patient is not nervous/anxious and does not have insomnia.         09/05/2022    8:26 AM 09/03/2021    8:42 AM 07/10/2021    2:43 PM 09/12/2020    8:26 AM 08/22/2019    8:38 AM  Depression screen PHQ 2/9  Decreased Interest 0 0 0 0 0  Down, Depressed, Hopeless 0 0 0 0 0  PHQ - 2 Score 0 0 0 0 0        Objective:  BP 120/80   Pulse 97   Ht 5' 6.75" (1.695 m)   Wt 158 lb 6.4 oz (71.8 kg)   BMI 25.00 kg/m  General appearance: alert, no distress, WD/WN, Caucasian male Skin: Several small slightly raised 2 to 3 mm yellowish-brown lesions on bilateral neck in the supraclavicular region and a few are pedunculated all suggesting benign skin tags, 2 benign-appearing raised papular lesions of the left neck, other scattered benign moles on torso, no new worrisome lesions HEENT: normocephalic, conjunctiva/corneas normal, sclerae anicteric, PERRLA, EOMi, nares patent, no discharge or erythema, pharynx normal Oral cavity: MMM, tongue normal, teeth in good repair Neck: supple, no lymphadenopathy, no thyromegaly, no masses, normal ROM, no bruits Chest: non tender, normal shape and expansion Heart: RRR, normal S1, S2, no murmurs Lungs: CTA bilaterally, no wheezes, rhonchi, or  rales Abdomen: +bs, soft, non tender, non distended, no masses, no hepatomegaly, no splenomegaly, no bruits Back: non tender, normal ROM, no scoliosis Musculoskeletal: upper extremities non tender, no obvious deformity, normal ROM throughout, lower extremities non tender, no obvious deformity, normal ROM throughout Extremities: no edema, no cyanosis, no clubbing Pulses: 2+ symmetric, upper and lower extremities, normal cap refill Neurological: alert, oriented x 3, CN2-12 intact, strength normal upper extremities and lower extremities, sensation normal throughout, DTRs 2+ throughout, no cerebellar signs, gait normal Psychiatric: normal affect, behavior normal, pleasant  GU: normal male external genitalia, small raised skin tag of left distal penis shaft, circumcised, nontender, no masses, no hernia, no lymphadenopathy Rectal: deferred   Assessment and Plan :   Encounter Diagnoses  Name Primary?   Encounter for health maintenance examination in adult Yes   Screening for lipid disorders    Screen for STD (sexually transmitted disease)    Erectile dysfunction, unspecified erectile dysfunction type      This visit was a preventative care visit, also known as wellness visit or routine physical.   Topics typically include healthy lifestyle, diet, exercise, preventative care, vaccinations, sick and well care, proper use of emergency dept and after hours care, as well as other concerns.     Recommendations: Continue to return yearly for your annual wellness and preventative care visits.  This gives [place] a chance to discuss healthy lifestyle, exercise, vaccinations, review your chart record, and perform screenings where appropriate.  I recommend you see your eye doctor yearly for routine vision care.  I recommend you see your dentist yearly for routine dental care including hygiene visits twice yearly.   Vaccination recommendations were reviewed Immunization History  Administered Date(s)  Administered   COVID-19, mRNA, vaccine(Comirnaty)12 years and older 12/15/2021   Hepatitis A, Adult 08/17/2017, 03/10/2018   Influenza Inj Mdck Quad Pf 12/15/2021   Influenza,inj,Quad PF,6+ Mos 02/07/2013   Meningococcal Conjugate 02/07/2013   PFIZER(Purple Top)SARS-COV-2 Vaccination 05/05/2019, 05/30/2019, 12/24/2019   Tdap 08/17/2017     Screening for cancer: Colon cancer screening: F/u as scheduled for colonoscopy soon  We discussed PSA, prostate exam, and prostate cancer screening risks/benefits.     Skin cancer screening: Check your skin regularly for new changes, growing lesions, or other lesions of concern Come in for evaluation if you have skin lesions of concern.  Lung cancer screening: If you have a greater than 20 pack year history of tobacco use, then you may qualify for lung cancer screening with a chest CT scan.   Please call your insurance company to inquire about coverage for this test.  We currently don't have screenings for other cancers besides breast, cervical, colon, and lung cancers.  If you have a strong family history of cancer or have other cancer screening concerns, please let me know.    Bone health:  Get at least 150 minutes of aerobic exercise weekly Get weight bearing exercise at least once weekly Bone density test:  A bone density test is an imaging test that uses a type of X-ray to measure the amount of calcium and other minerals in your bones. The test may be used to diagnose or screen you for a condition that causes weak or thin bones (osteoporosis), predict your risk for a broken bone (fracture), or determine how well your osteoporosis treatment is working. The bone density test is recommended for females 65 and older, or females or males <65 if certain risk factors such as thyroid disease, long term use of steroids such as for asthma or rheumatological issues, vitamin D deficiency, estrogen deficiency, family history of osteoporosis, self or family  history of fragility fracture in first degree relative.    Heart health: Get at least 150 minutes of aerobic exercise weekly Limit alcohol It is important to maintain a healthy blood pressure and healthy cholesterol numbers  Heart disease screening: Screening for heart disease includes screening for blood pressure, fasting lipids, glucose/diabetes screening, BMI height to weight ratio, reviewed of smoking status, physical activity, and diet.    Goals include blood pressure 120/80 or less, maintaining a healthy lipid/cholesterol profile, preventing diabetes or keeping diabetes numbers under good control, not smoking or using tobacco products, exercising most days per week or at least 150 minutes per week of exercise, and eating healthy variety of fruits and vegetables, healthy oils, and avoiding unhealthy food choices like fried food, fast food, high sugar and high cholesterol foods.    Other tests may possibly include EKG test, CT coronary calcium score, echocardiogram, exercise treadmill stress test.   Reviewed 2019 Echocardiogram showing mild LVH   Medical care options: I recommend you continue to seek care here first for routine care.  We try really hard to have available appointments Monday through Friday daytime hours for sick visits, acute visits, and physicals.  Urgent care should be used for after hours and weekends for significant issues that cannot wait till the next day.  The emergency department should be used for significant potentially life-threatening emergencies.  The emergency department is expensive, can often have long wait times for less significant concerns, so try to utilize primary care, urgent care, or telemedicine when possible to avoid unnecessary trips to the emergency department.  Virtual visits and telemedicine have been introduced since the pandemic started in 2020, and can be convenient ways to receive medical care.  We offer virtual appointments as well to assist  you in a variety of options to seek medical care.   Advanced Directives: I recommend you consider completing a Health Care Power of Attorney and Living Will.   These documents respect your wishes and help alleviate burdens on your loved ones if you were to become terminally ill or be in a position to need those documents enforced.    You can complete Advanced Directives yourself, have them notarized, then have copies made for our office, for you and for anybody you feel should have them in safe keeping.  Or, you can have an attorney prepare these documents.   If you haven't updated your Last Will and Testament in a while, it may be worthwhile having an attorney prepare these documents together and save on some costs.       Separate significant issues discussed: Erectile dysfunction - doing fine on Viagra.  Skin tags - reassured   Gumaro was seen today for annual exam.  Diagnoses and all orders for this visit:  Encounter for health maintenance examination in adult -     Comprehensive metabolic panel -     CBC -     Lipid panel -     VITAMIN D 25 Hydroxy (Vit-D Deficiency, Fractures) -     POCT Urinalysis DIP (Proadvantage Device) -     RPR+HIV+GC+CT Panel -     Hepatitis C antibody -     Hepatitis B surface antigen  Screening for lipid disorders -     Lipid panel  Screen for STD (sexually transmitted disease) -     RPR+HIV+GC+CT Panel -     Hepatitis C antibody -     Hepatitis B surface antigen  Erectile dysfunction, unspecified erectile dysfunction type  Other orders -     sildenafil (VIAGRA) 100 MG tablet; Take 0.5-1 tablets (50-100 mg total) by mouth daily as needed for erectile dysfunction.    Follow-up pending labs, yearly for physical

## 2022-09-08 ENCOUNTER — Other Ambulatory Visit: Payer: Self-pay | Admitting: Medical

## 2022-09-08 LAB — COMPREHENSIVE METABOLIC PANEL
ALT: 39 IU/L (ref 0–44)
AST: 38 IU/L (ref 0–40)
Albumin: 4.6 g/dL (ref 4.1–5.1)
Alkaline Phosphatase: 75 IU/L (ref 44–121)
BUN/Creatinine Ratio: 19 (ref 9–20)
BUN: 20 mg/dL (ref 6–24)
Bilirubin Total: 0.6 mg/dL (ref 0.0–1.2)
CO2: 26 mmol/L (ref 20–29)
Calcium: 9.8 mg/dL (ref 8.7–10.2)
Chloride: 100 mmol/L (ref 96–106)
Creatinine, Ser: 1.04 mg/dL (ref 0.76–1.27)
Globulin, Total: 2.7 g/dL (ref 1.5–4.5)
Glucose: 95 mg/dL (ref 70–99)
Potassium: 4.4 mmol/L (ref 3.5–5.2)
Sodium: 138 mmol/L (ref 134–144)
Total Protein: 7.3 g/dL (ref 6.0–8.5)
eGFR: 90 mL/min/{1.73_m2} (ref >59)

## 2022-09-08 LAB — RPR+HIV+GC+CT PANEL
Chlamydia trachomatis, NAA: NEGATIVE
HIV Screen 4th Generation wRfx: NONREACTIVE
Neisseria Gonorrhoeae by PCR: NEGATIVE
RPR Ser Ql: NONREACTIVE

## 2022-09-08 LAB — CBC
Hematocrit: 46.7 % (ref 37.5–51.0)
Hemoglobin: 15.7 g/dL (ref 13.0–17.7)
MCH: 30.5 pg (ref 26.6–33.0)
MCHC: 33.6 g/dL (ref 31.5–35.7)
MCV: 91 fL (ref 79–97)
Platelets: 194 10*3/uL (ref 150–450)
RBC: 5.14 x10E6/uL (ref 4.14–5.80)
RDW: 12.7 % (ref 11.6–15.4)
WBC: 6.1 10*3/uL (ref 3.4–10.8)

## 2022-09-08 LAB — LIPID PANEL
Chol/HDL Ratio: 3.2 ratio (ref 0.0–5.0)
Cholesterol, Total: 193 mg/dL (ref 100–199)
HDL: 60 mg/dL (ref >39)
LDL Chol Calc (NIH): 115 mg/dL — ABNORMAL HIGH (ref 0–99)
Triglycerides: 103 mg/dL (ref 0–149)
VLDL Cholesterol Cal: 18 mg/dL (ref 5–40)

## 2022-09-08 LAB — VITAMIN D 25 HYDROXY (VIT D DEFICIENCY, FRACTURES): Vit D, 25-Hydroxy: 33.4 ng/mL (ref 30.0–100.0)

## 2022-09-08 LAB — HEPATITIS C ANTIBODY: Hep C Virus Ab: NONREACTIVE

## 2022-09-08 LAB — HEPATITIS B SURFACE ANTIGEN: Hepatitis B Surface Ag: NEGATIVE

## 2022-09-08 MED ORDER — VITAMIN D (ERGOCALCIFEROL) 1.25 MG (50000 UNIT) PO CAPS: 50000.0000 [IU] | ORAL_CAPSULE | ORAL | 1 refills | Status: DC

## 2022-09-08 NOTE — [facility] Notes (Signed)
 Results sent through MyChart

## 2022-09-19 ENCOUNTER — Ambulatory Visit (AMBULATORY_SURGERY_CENTER): Payer: BC Managed Care – PPO

## 2022-09-19 ENCOUNTER — Encounter: Payer: Self-pay | Admitting: Gastroenterology

## 2022-09-19 VITALS — Ht 67.0 in | Wt 161.0 lb

## 2022-09-19 DIAGNOSIS — Z1211 Encounter for screening for malignant neoplasm of colon: Secondary | ICD-10-CM

## 2022-09-19 MED ORDER — NA SULFATE-K SULFATE-MG SULF 17.5-3.13-1.6 GM/177ML PO SOLN
1.0000 | Freq: Once | ORAL | 0 refills | Status: AC
Start: 2022-09-19 — End: 2022-09-19

## 2022-09-19 NOTE — Progress Notes (Signed)

## 2022-09-25 HISTORY — PX: COLONOSCOPY: SHX174

## 2022-10-05 ENCOUNTER — Other Ambulatory Visit: Payer: Self-pay | Admitting: Medical

## 2022-10-17 ENCOUNTER — Ambulatory Visit (AMBULATORY_SURGERY_CENTER): Payer: BC Managed Care – PPO | Admitting: Gastroenterology

## 2022-10-17 ENCOUNTER — Encounter: Payer: Self-pay | Admitting: Gastroenterology

## 2022-10-17 VITALS — BP 97/53 | HR 57 | Temp 98.6°F | Resp 9 | Ht 67.0 in | Wt 161.0 lb

## 2022-10-17 DIAGNOSIS — Z1211 Encounter for screening for malignant neoplasm of colon: Secondary | ICD-10-CM

## 2022-10-17 DIAGNOSIS — D123 Benign neoplasm of transverse colon: Secondary | ICD-10-CM

## 2022-10-17 DIAGNOSIS — K635 Polyp of colon: Secondary | ICD-10-CM

## 2022-10-17 MED ORDER — SODIUM CHLORIDE 0.9 % IV SOLN: 500.0000 mL | Freq: Once | INTRAVENOUS | Status: DC

## 2022-10-17 NOTE — [facility] Notes (Signed)
Post procedure follow up phone call. No answer at number given.  Left message on voicemail.  

## 2022-10-17 NOTE — Progress Notes (Signed)
Vss nad trans to pacu 

## 2022-10-17 NOTE — [facility] Notes (Signed)
[facility] History and Physical   Primary Care Physician:  Charles Canavan, PA-C   Reason for Procedure:   CRC Screening  Plan:    colon     HPI: Charles Bartlett is a 47 y.o. male    Past Medical History:  Diagnosis Date   H/O echocardiogram 04/2017   mild LVH, Dr. Jacinto Bartlett; eval due to chest discomfort   Wears contact lenses     Past Surgical History:  Procedure Laterality Date   WISDOM TOOTH EXTRACTION      Prior to Admission medications   Medication Sig Start Date End Date Taking? Authorizing Provider  Creatine Monohydrate 1.5 GM/5ML LIQD    Yes [provider]  Cyanocobalamin (B-12) 1000 MCG CAPS    Yes [provider]  Multiple Vitamin (MULTIVITAMIN WITH MINERALS) TABS Take 1 tablet by mouth daily.   Yes [provider]  Na Sulfate-K Sulfate-Mg Sulf 17.5-3.13-1.6 GM/177ML SOLN See admin instructions. 09/19/22  Yes [provider]  Omega-3 Fatty Acids (SUPER OMEGA 3) 500 MG CAPS Take 1 capsule by mouth daily.   Yes [provider]  Vitamin D, Ergocalciferol, (DRISDOL) 1.25 MG (50000 UNIT) CAPS capsule Take 1 capsule (50,000 Units total) by mouth every 7 (seven) days. 09/08/22  Yes Bartlett, Charles Balo, PA-C  sildenafil (VIAGRA) 100 MG tablet TAKE 0.5-1 TABLETS BY MOUTH DAILY AS NEEDED FOR ERECTILE DYSFUNCTION. 10/06/22   Bartlett, Charles Balo, PA-C    Current Outpatient Medications  Medication Sig Dispense Refill   Creatine Monohydrate 1.5 GM/5ML LIQD      Cyanocobalamin (B-12) 1000 MCG CAPS      Multiple Vitamin (MULTIVITAMIN WITH MINERALS) TABS Take 1 tablet by mouth daily.     Na Sulfate-K Sulfate-Mg Sulf 17.5-3.13-1.6 GM/177ML SOLN See admin instructions.     Omega-3 Fatty Acids (SUPER OMEGA 3) 500 MG CAPS Take 1 capsule by mouth daily.     Vitamin D, Ergocalciferol, (DRISDOL) 1.25 MG (50000 UNIT) CAPS capsule Take 1 capsule (50,000 Units total) by mouth every 7 (seven) days. 12 capsule 1   sildenafil (VIAGRA) 100  MG tablet TAKE 0.5-1 TABLETS BY MOUTH DAILY AS NEEDED FOR ERECTILE DYSFUNCTION. 6 tablet 0   Current Facility-Administered Medications  Medication Dose Route Frequency Provider Last Rate Last Admin   0.9 %  sodium chloride infusion  500 mL Intravenous Once Charles Bologna, MD        Allergies as of 10/17/2022 - Review Complete 10/17/2022  Allergen Reaction Noted   Gelatin  04/06/2017    Family History  Problem Relation Age of Onset   Other Mother        old age   Cancer Neg Hx    Heart disease Neg Hx    Stroke Neg Hx    Diabetes Neg Hx    Colon cancer Neg Hx    Colon polyps Neg Hx    Esophageal cancer Neg Hx    Rectal cancer Neg Hx    Stomach cancer Neg Hx     Social History   Socioeconomic History   Marital status: Divorced    Spouse name: Charles Bartlett   Number of children: 2   Years of education: Master's   Highest education level: Master's degree (e.g., MA, MS, MEng, MEd, MSW, MBA)  Occupational History   Occupation: Art gallery manager  Tobacco Use   Smoking status: Never   Smokeless tobacco: Never  Vaping Use   Vaping status: Never Used  Substance and Sexual Activity   Alcohol use: No  Drug use: No   Sexual activity: Not on file  Other Topics Concern   Not on file  Social History Narrative   Lives with his children, gets them half time.   Divorced.  Has two teenage children and his stepson.  Originally from [place].  Lives in [place], works in [employer].  Comptroller.  Exercises 5 days per week.  08/2020   Social Determinants of Health   Financial Resource Strain: Low Risk  (07/08/2021)   Overall Financial Resource Strain (CARDIA)    Difficulty of Paying Living Expenses: Not hard at all  Food Insecurity: No Food Insecurity (07/08/2021)   Hunger Vital Sign    Worried About Running Out of Food in the Last Year: Never true    Ran Out of Food in the Last Year: Never true  Transportation Needs: No Transportation Needs (07/08/2021)   PRAPARE - Doctor, general practice (Medical): No    Lack of Transportation (Non-Medical): No  Physical Activity: Sufficiently Active (07/08/2021)   Exercise Vital Sign    Days of Exercise per Week: 4 days    Minutes of Exercise per Session: 60 min  Stress: No Stress Concern Present (07/08/2021)   [employer] of Occupational Health - Occupational Stress Questionnaire    Feeling of Stress : Not at all  Social Connections: Moderately Isolated (07/08/2021)   Social Connection and Isolation Panel [NHANES]    Frequency of Communication with Friends and Family: More than three times a week    Frequency of Social Gatherings with Friends and Family: Twice a week    Attends Religious Services: More than 4 times per year    Active Member of [employer] or Organizations: No    Attends Engineer, structural: Not on file    Marital Status: Divorced  Intimate Partner Violence: Not on file    Review of Systems: Positive for none All other review of systems negative except as mentioned in the HPI.  Physical Exam: Vital signs in last 24 hours: @VSRANGES @   General:   Alert,  Well-developed, well-nourished, pleasant and cooperative in NAD Lungs:  Clear throughout to auscultation.   Heart:  Regular rate and rhythm; no murmurs, clicks, rubs,  or gallops. Abdomen:  Soft, nontender and nondistended. Normal bowel sounds.   Neuro/Psych:  Alert and cooperative. Normal mood and affect. A and O x 3    No significant changes were identified.  The patient continues to be an appropriate candidate for the planned procedure and anesthesia.   Charles Circle, MD. [facility] 10/17/2022 8:59 AM@

## 2022-10-17 NOTE — Patient Instructions (Signed)
YOU HAD AN ENDOSCOPIC PROCEDURE TODAY AT THE [facility]:   Refer to the procedure report that was given to you for any specific questions about what was found during the examination.  If the procedure report does not answer your questions, please call your gastroenterologist to clarify.  If you requested that your care partner not be given the details of your procedure findings, then the procedure report has been included in a sealed envelope for you to review at your convenience later.  **Handouts given on polyps and hemorrhoids**  YOU SHOULD EXPECT: Some feelings of bloating in the abdomen. Passage of more gas than usual.  Walking can help get rid of the air that was put into your GI tract during the procedure and reduce the bloating. If you had a lower endoscopy (such as a colonoscopy or flexible sigmoidoscopy) you may notice spotting of blood in your stool or on the toilet paper. If you underwent a bowel prep for your procedure, you may not have a normal bowel movement for a few days.  Please Note:  You might notice some irritation and congestion in your nose or some drainage.  This is from the oxygen used during your procedure.  There is no need for concern and it should clear up in a day or so.  SYMPTOMS TO REPORT IMMEDIATELY:  Following lower endoscopy (colonoscopy or flexible sigmoidoscopy):  Excessive amounts of blood in the stool  Significant tenderness or worsening of abdominal pains  Swelling of the abdomen that is new, acute  Fever of 100F or higher  For urgent or emergent issues, a gastroenterologist can be reached at any hour by calling [phone]. Do not use MyChart messaging for urgent concerns.    DIET:  We do recommend a small meal at first, but then you may proceed to your regular diet.  Drink plenty of fluids but you should avoid alcoholic beverages for 24 hours.  ACTIVITY:  You should plan to take it easy for the rest of today and you should NOT DRIVE or  use heavy machinery until tomorrow (because of the sedation medicines used during the test).    FOLLOW UP: Our staff will call the number listed on your records the next business day following your procedure.  We will call around 7:15- 8:00 am to check on you and address any questions or concerns that you may have regarding the information given to you following your procedure. If we do not reach you, we will leave a message.     If any biopsies were taken you will be contacted by phone or by letter within the next 1-3 weeks.  Please call us at [phone] if you have not heard about the biopsies in 3 weeks.    SIGNATURES/CONFIDENTIALITY: You and/or your care partner have signed paperwork which will be entered into your electronic medical record.  These signatures attest to the fact that that the information above on your After Visit Summary has been reviewed and is understood.  Full responsibility of the confidentiality of this discharge information lies with you and/or your care-partner.  

## 2022-10-17 NOTE — Op Note (Signed)
[facility] Patient Name: Charles Bartlett Procedure Date: 10/17/2022 8:57 AM MRN: [id number] Endoscopist: Lynann Bologna , MD, [id number] Age: 47 Referring MD:  Date of Birth: [birth year] Gender: Male Account #: [id number] Procedure:                Colonoscopy Indications:              Screening for colorectal malignant neoplasm Medicines:                Monitored Anesthesia Care Procedure:                Pre-Anesthesia Assessment:                           - Prior to the procedure, a History and Physical                            was performed, and patient medications and                            allergies were reviewed. The patient's tolerance of                            previous anesthesia was also reviewed. The risks                            and benefits of the procedure and the sedation                            options and risks were discussed with the patient.                            All questions were answered, and informed consent                            was obtained. Prior Anticoagulants: The patient has                            taken no anticoagulant or antiplatelet agents. ASA                            Grade Assessment: I - A normal, healthy patient.                            After reviewing the risks and benefits, the patient                            was deemed in satisfactory condition to undergo the                            procedure.                           After obtaining informed consent, the colonoscope  was passed under direct vision. Throughout the                            procedure, the patient's blood pressure, pulse, and                            oxygen saturations were monitored continuously. The                            [id number]-HQ190L Colonoscope [id number] was introduced                            through the anus and advanced to the 2 cm into the                            ileum. The colonoscopy was  performed without                            difficulty. Colon was highly redundant. Passage of                            scope was assisted by abdominal pressure. The                            patient tolerated the procedure well. The quality                            of the bowel preparation was good. The terminal                            ileum, ileocecal valve, appendiceal orifice, and                            rectum were photographed. Scope In: 9:16:30 AM Scope Out: 9:37:17 AM Scope Withdrawal Time: 0 hours 9 minutes 24 seconds  Total Procedure Duration: 0 hours 20 minutes 47 seconds  Findings:                 A 6 mm polyp was found in the mid transverse colon.                            The polyp was sessile. The polyp was removed with a                            cold snare. Resection and retrieval were complete.                           Non-bleeding internal hemorrhoids were found during                            retroflexion. The hemorrhoids were small and Grade                            I (internal  hemorrhoids that do not prolapse).                           The terminal ileum appeared normal.                           The exam was otherwise without abnormality on                            direct and retroflexion views. Complications:            No immediate complications. Estimated Blood Loss:     Estimated blood loss: none. Impression:               - One 6 mm polyp in the mid transverse colon,                            removed with a cold snare. Resected and retrieved.                           - Non-bleeding internal hemorrhoids.                           - The examined portion of the ileum was normal.                           - The examination was otherwise normal on direct                            and retroflexion views. Recommendation:           - Patient has a contact number available for                            emergencies. The signs and symptoms of  potential                            delayed complications were discussed with the                            patient. Return to normal activities tomorrow.                            Written discharge instructions were provided to the                            patient.                           - Resume previous diet.                           - Continue present medications.                           - Await pathology results.                           -  Repeat colonoscopy for surveillance based on                            pathology results.                           - The findings and recommendations were discussed                            with the patient's family. Lynann Bologna, MD 10/17/2022 9:47:42 AM This report has been signed electronically.

## 2022-10-17 NOTE — Progress Notes (Signed)
VS completed by CW.   Pt's states no medical or surgical changes since previsit or office visit.  

## 2022-10-20 ENCOUNTER — Telehealth: Payer: Self-pay

## 2022-10-20 NOTE — Telephone Encounter (Signed)
Attempted f/u call. No answer, left VM. 

## 2022-10-21 ENCOUNTER — Encounter: Payer: Self-pay | Admitting: Gastroenterology

## 2022-11-04 ENCOUNTER — Other Ambulatory Visit: Payer: Self-pay | Admitting: Medical

## 2022-11-04 NOTE — Telephone Encounter (Signed)
Is this okay to refill? 

## 2022-11-20 DIAGNOSIS — H02814 Retained foreign body in left upper eyelid: Secondary | ICD-10-CM | POA: Diagnosis not present

## 2023-09-07 ENCOUNTER — Encounter: Payer: Self-pay | Admitting: Medical

## 2023-09-07 ENCOUNTER — Ambulatory Visit (INDEPENDENT_AMBULATORY_CARE_PROVIDER_SITE_OTHER): Payer: BC Managed Care – PPO | Admitting: Medical

## 2023-09-07 VITALS — BP 124/72 | HR 89 | Ht 68.0 in | Wt 160.6 lb

## 2023-09-07 DIAGNOSIS — Z0000 Encounter for general adult medical examination without abnormal findings: Secondary | ICD-10-CM

## 2023-09-07 DIAGNOSIS — E559 Vitamin D deficiency, unspecified: Secondary | ICD-10-CM

## 2023-09-07 DIAGNOSIS — Z13220 Encounter for screening for lipoid disorders: Secondary | ICD-10-CM | POA: Diagnosis not present

## 2023-09-07 DIAGNOSIS — Z113 Encounter for screening for infections with a predominantly sexual mode of transmission: Secondary | ICD-10-CM

## 2023-09-07 DIAGNOSIS — Z125 Encounter for screening for malignant neoplasm of prostate: Secondary | ICD-10-CM

## 2023-09-07 DIAGNOSIS — Z136 Encounter for screening for cardiovascular disorders: Secondary | ICD-10-CM

## 2023-09-07 MED ORDER — SILDENAFIL CITRATE 100 MG PO TABS: 100.0000 mg | ORAL_TABLET | ORAL | 11 refills | Status: AC | PRN

## 2023-09-07 NOTE — Progress Notes (Signed)
 Subjective:   HPI  Charles Bartlett is a 48 y.o. male who presents for Chief Complaint  Patient presents with   Annual Exam    Nonfasting CPE- had coffee with creamer. Sleeping on right shoulder it will hurt some but can move it fine, no other issues   Patient Care Team: Nocole Zammit, Alm GORMAN RIGGERS as PCP - General (Family Medicine) Sees dentist Sees eye doctor  Concerns: History of vitamin D  deficiency.  He was using vitamin D  regularly he ran out of prescription.  Viagra  continues to work well.  However, he has concerns about premature ejaculation.  Wants to see what options are available for therapy.  He has had some pain in his right shoulder when he sleeps on that side.  No injury, no trauma.  No pain otherwise, just at night when sleeping on the right side.  Reviewed their medical, surgical, family, social, medication, and allergy history and updated chart as appropriate.  Past Medical History:  Diagnosis Date   H/O echocardiogram 04/2017   mild LVH, Dr. Ladona; eval due to chest discomfort   Vitamin D  deficiency    Wears contact lenses     Past Surgical History:  Procedure Laterality Date   COLONOSCOPY  09/2022   Dr. Lynnie Bring; repeat 2034   WISDOM TOOTH EXTRACTION      Family History  Problem Relation Age of Onset   Other Mother        old age   Cancer Neg Hx    Heart disease Neg Hx    Stroke Neg Hx    Diabetes Neg Hx    Colon cancer Neg Hx    Colon polyps Neg Hx    Esophageal cancer Neg Hx    Rectal cancer Neg Hx    Stomach cancer Neg Hx      Current Outpatient Medications:    Creatine Monohydrate 1.5 GM/5ML LIQD, , Disp: , Rfl:    Cyanocobalamin (B-12) 1000 MCG CAPS, , Disp: , Rfl:    Multiple Vitamin (MULTIVITAMIN WITH MINERALS) TABS, Take 1 tablet by mouth daily., Disp: , Rfl:    Omega-3 Fatty Acids (SUPER OMEGA 3) 500 MG CAPS, Take 1 capsule by mouth daily., Disp: , Rfl:    Vitamin D , Ergocalciferol , (DRISDOL ) 1.25 MG (50000 UNIT) CAPS capsule, Take  1 capsule (50,000 Units total) by mouth every 7 (seven) days., Disp: 12 capsule, Rfl: 1   Na Sulfate-K Sulfate-Mg Sulf 17.5-3.13-1.6 GM/177ML SOLN, See admin instructions., Disp: , Rfl:    sildenafil  (VIAGRA ) 100 MG tablet, Take 1 tablet (100 mg total) by mouth as needed for erectile dysfunction., Disp: 6 tablet, Rfl: 11  Allergies  Allergen Reactions   Gelatin     sensitivity    Review of Systems  Constitutional:  Negative for chills, fever, malaise/fatigue and weight loss.  HENT:  Negative for congestion, ear pain, hearing loss, sore throat and tinnitus.   Eyes:  Negative for blurred vision, pain and redness.  Respiratory:  Negative for cough, hemoptysis and shortness of breath.   Cardiovascular:  Negative for chest pain, palpitations, orthopnea, claudication and leg swelling.  Gastrointestinal:  Negative for abdominal pain, blood in stool, constipation, diarrhea, nausea and vomiting.  Genitourinary:  Negative for dysuria, flank pain, frequency, hematuria and urgency.  Musculoskeletal:  Positive for joint pain. Negative for falls and myalgias.  Skin:  Negative for itching and rash.  Neurological:  Negative for dizziness, tingling, speech change, weakness and headaches.  Endo/Heme/Allergies:  Negative for polydipsia. Does not  bruise/bleed easily.  Psychiatric/Behavioral:  Negative for depression and memory loss. The patient is not nervous/anxious and does not have insomnia.         09/07/2023    8:16 AM 09/05/2022    8:26 AM 09/03/2021    8:42 AM 07/10/2021    2:43 PM 09/12/2020    8:26 AM  Depression screen PHQ 2/9  Decreased Interest 0 0 0 0 0  Down, Depressed, Hopeless 0 0 0 0 0  PHQ - 2 Score 0 0 0 0 0        Objective:  BP 124/72   Pulse 89   Ht 5' 8 (1.727 m)   Wt 160 lb 9.6 oz (72.8 kg)   BMI 24.42 kg/m   General appearance: alert, no distress, WD/WN, Caucasian male Skin: scattered benign moles on torso, no new worrisome lesions HEENT: normocephalic,  conjunctiva/corneas normal, sclerae anicteric, PERRLA, EOMi, nares patent, no discharge or erythema, pharynx normal Oral cavity: MMM, tongue normal, teeth in good repair Neck: supple, no lymphadenopathy, no thyromegaly, no masses, normal ROM, no bruits Chest: non tender, normal shape and expansion Heart: RRR, normal S1, S2, no murmurs Lungs: CTA bilaterally, no wheezes, rhonchi, or rales Abdomen: +bs, soft, non tender, non distended, no masses, no hepatomegaly, no splenomegaly, no bruits Back: non tender, normal ROM, no scoliosis Musculoskeletal: upper extremities non tender, no obvious deformity, normal ROM throughout, lower extremities non tender, no obvious deformity, normal ROM throughout Extremities: no edema, no cyanosis, no clubbing Pulses: 2+ symmetric, upper and lower extremities, normal cap refill Neurological: alert, oriented x 3, CN2-12 intact, strength normal upper extremities and lower extremities, sensation normal throughout, DTRs 2+ throughout, no cerebellar signs, gait normal Psychiatric: normal affect, behavior normal, pleasant  GU: normal male external genitalia, small patch of hypopigmented skin left scrotum, circumcised, nontender, no masses, no hernia, no lymphadenopathy Rectal: deferred   Assessment and Plan :   Encounter Diagnoses  Name Primary?   Encounter for health maintenance examination in adult Yes   Encounter for lipid screening for cardiovascular disease    Vitamin D  deficiency    Screen for STD (sexually transmitted disease)    Screening for prostate cancer      This visit was a preventative care visit, also known as wellness visit or routine physical.   Topics typically include healthy lifestyle, diet, exercise, preventative care, vaccinations, sick and well care, proper use of emergency dept and after hours care, as well as other concerns.     Recommendations: Continue to return yearly for your annual wellness and preventative care visits.  This  gives us  a chance to discuss healthy lifestyle, exercise, vaccinations, review your chart record, and perform screenings where appropriate.  I recommend you see your eye doctor yearly for routine vision care.  I recommend you see your dentist yearly for routine dental care including hygiene visits twice yearly.   Vaccination recommendations were reviewed Immunization History  Administered Date(s) Administered   Hepatitis A, Adult 08/17/2017, 03/10/2018   Influenza Inj Mdck Quad Pf 12/15/2021   Influenza,inj,Quad PF,6+ Mos 02/07/2013   Meningococcal Conjugate 02/07/2013   PFIZER(Purple Top)SARS-COV-2 Vaccination 05/05/2019, 05/30/2019, 12/24/2019   Pfizer(Comirnaty)Fall Seasonal Vaccine 12 years and older 12/15/2021   Tdap 08/17/2017   I recommend yearly flu shot   Screening for cancer: Colon cancer screening: I reviewed his 2024 colonoscopy report.  Due repeat in 10 years.  We discussed PSA, prostate exam, and prostate cancer screening risks/benefits.     Skin cancer screening: Check  your skin regularly for new changes, growing lesions, or other lesions of concern Come in for evaluation if you have skin lesions of concern.  Lung cancer screening: If you have a greater than 20 pack year history of tobacco use, then you may qualify for lung cancer screening with a chest CT scan.   Please call your insurance company to inquire about coverage for this test.  We currently don't have screenings for other cancers besides breast, cervical, colon, and lung cancers.  If you have a strong family history of cancer or have other cancer screening concerns, please let me know.    Bone health: Get at least 150 minutes of aerobic exercise weekly Get weight bearing exercise at least once weekly Bone density test:  A bone density test is an imaging test that uses a type of X-ray to measure the amount of calcium  and other minerals in your bones. The test may be used to diagnose or screen you  for a condition that causes weak or thin bones (osteoporosis), predict your risk for a broken bone (fracture), or determine how well your osteoporosis treatment is working. The bone density test is recommended for females 65 and older, or females or males <65 if certain risk factors such as thyroid disease, long term use of steroids such as for asthma or rheumatological issues, vitamin D  deficiency, estrogen deficiency, family history of osteoporosis, self or family history of fragility fracture in first degree relative.    Heart health: Get at least 150 minutes of aerobic exercise weekly Limit alcohol It is important to maintain a healthy blood pressure and healthy cholesterol numbers  Heart disease screening: Screening for heart disease includes screening for blood pressure, fasting lipids, glucose/diabetes screening, BMI height to weight ratio, reviewed of smoking status, physical activity, and diet.    Goals include blood pressure 120/80 or less, maintaining a healthy lipid/cholesterol profile, preventing diabetes or keeping diabetes numbers under good control, not smoking or using tobacco products, exercising most days per week or at least 150 minutes per week of exercise, and eating healthy variety of fruits and vegetables, healthy oils, and avoiding unhealthy food choices like fried food, fast food, high sugar and high cholesterol foods.    Other tests may possibly include EKG test, CT coronary calcium  score, echocardiogram, exercise treadmill stress test.   Reviewed 2019 Echocardiogram showing mild LVH   Medical care options: I recommend you continue to seek care here first for routine care.  We try really hard to have available appointments Monday through Friday daytime hours for sick visits, acute visits, and physicals.  Urgent care should be used for after hours and weekends for significant issues that cannot wait till the next day.  The emergency department should be used for  significant potentially life-threatening emergencies.  The emergency department is expensive, can often have long wait times for less significant concerns, so try to utilize primary care, urgent care, or telemedicine when possible to avoid unnecessary trips to the emergency department.  Virtual visits and telemedicine have been introduced since the pandemic started in 2020, and can be convenient ways to receive medical care.  We offer virtual appointments as well to assist you in a variety of options to seek medical care.   Advanced Directives: I recommend you consider completing a Health Care Power of Attorney and Living Will.   These documents respect your wishes and help alleviate burdens on your loved ones if you were to become terminally ill or be in a  position to need those documents enforced.    You can complete Advanced Directives yourself, have them notarized, then have copies made for our office, for you and for anybody you feel should have them in safe keeping.  Or, you can have an attorney prepare these documents.   If you haven't updated your Last Will and Testament in a while, it may be worthwhile having an attorney prepare these documents together and save on some costs.       Separate significant issues discussed: Erectile dysfunction - doing fine on Viagra .  Premature ejaculation -we discussed possible therapeutic options including SSRI, clomipramine, topical lidocaine and others.  We discussed other strategies.  He wants to hold off on prescription medicines at this time after discussing options.  Right shoulder pain when lying on the right side-we discussed possible causes.  We discussed some rehab exercises and avoiding sleeping on that side for the time being.  Use some over-the-counter Aleve for the next week.  If symptoms persist we can get a baseline x-ray.   [place] was seen today for annual exam.  Diagnoses and all orders for this visit:  Encounter for health  maintenance examination in adult -     Comprehensive metabolic panel with GFR -     CBC -     Lipid panel -     PSA -     VITAMIN D  25 Hydroxy (Vit-D Deficiency, Fractures) -     HIV Antibody (routine testing w rflx) -     RPR -     GC/Chlamydia Probe Amp -     Hepatitis C antibody -     Hepatitis B surface antigen  Encounter for lipid screening for cardiovascular disease -     Lipid panel  Vitamin D  deficiency -     VITAMIN D  25 Hydroxy (Vit-D Deficiency, Fractures)  Screen for STD (sexually transmitted disease) -     HIV Antibody (routine testing w rflx) -     RPR -     GC/Chlamydia Probe Amp -     Hepatitis C antibody -     Hepatitis B surface antigen  Screening for prostate cancer -     PSA  Other orders -     sildenafil  (VIAGRA ) 100 MG tablet; Take 1 tablet (100 mg total) by mouth as needed for erectile dysfunction.    Follow-up pending labs, yearly for physical

## 2023-09-08 ENCOUNTER — Other Ambulatory Visit: Payer: Self-pay | Admitting: Medical

## 2023-09-08 ENCOUNTER — Ambulatory Visit: Payer: Self-pay | Admitting: Medical

## 2023-09-08 LAB — LIPID PANEL
Chol/HDL Ratio: 3.7 ratio (ref 0.0–5.0)
Cholesterol, Total: 223 mg/dL — ABNORMAL HIGH (ref 100–199)
HDL: 60 mg/dL (ref >39)
LDL Chol Calc (NIH): 147 mg/dL — ABNORMAL HIGH (ref 0–99)
Triglycerides: 88 mg/dL (ref 0–149)
VLDL Cholesterol Cal: 16 mg/dL (ref 5–40)

## 2023-09-08 LAB — HIV ANTIBODY (ROUTINE TESTING W REFLEX): HIV Screen 4th Generation wRfx: NONREACTIVE

## 2023-09-08 LAB — CBC
Hematocrit: 49.3 % (ref 37.5–51.0)
Hemoglobin: 16.5 g/dL (ref 13.0–17.7)
MCH: 30.7 pg (ref 26.6–33.0)
MCHC: 33.5 g/dL (ref 31.5–35.7)
MCV: 92 fL (ref 79–97)
Platelets: 214 x10E3/uL (ref 150–450)
RBC: 5.38 x10E6/uL (ref 4.14–5.80)
RDW: 13.2 % (ref 11.6–15.4)
WBC: 6.3 x10E3/uL (ref 3.4–10.8)

## 2023-09-08 LAB — VITAMIN D 25 HYDROXY (VIT D DEFICIENCY, FRACTURES): Vit D, 25-Hydroxy: 28.7 ng/mL — AB (ref 30.0–100.0)

## 2023-09-08 LAB — COMPREHENSIVE METABOLIC PANEL WITH GFR
ALT: 24 IU/L (ref 0–44)
AST: 26 IU/L (ref 0–40)
Albumin: 4.7 g/dL (ref 4.1–5.1)
Alkaline Phosphatase: 73 IU/L (ref 44–121)
BUN/Creatinine Ratio: 13 (ref 9–20)
BUN: 15 mg/dL (ref 6–24)
Bilirubin Total: 0.6 mg/dL (ref 0.0–1.2)
CO2: 21 mmol/L (ref 20–29)
Calcium: 9.8 mg/dL (ref 8.7–10.2)
Chloride: 102 mmol/L (ref 96–106)
Creatinine, Ser: 1.13 mg/dL (ref 0.76–1.27)
Globulin, Total: 2.7 g/dL (ref 1.5–4.5)
Glucose: 84 mg/dL (ref 70–99)
Potassium: 4.1 mmol/L (ref 3.5–5.2)
Sodium: 140 mmol/L (ref 134–144)
Total Protein: 7.4 g/dL (ref 6.0–8.5)
eGFR: 81 mL/min/1.73 (ref >59)

## 2023-09-08 LAB — HEPATITIS B SURFACE ANTIGEN

## 2023-09-08 LAB — RPR: RPR Ser Ql: NONREACTIVE

## 2023-09-08 LAB — HEPATITIS C ANTIBODY: Hep C Virus Ab: NONREACTIVE

## 2023-09-08 LAB — PSA: Prostate Specific Ag, Serum: 1.1 ng/mL (ref 0.0–4.0)

## 2023-09-08 MED ORDER — VITAMIN D (ERGOCALCIFEROL) 1.25 MG (50000 UNIT) PO CAPS: 50000.0000 [IU] | ORAL_CAPSULE | ORAL | 2 refills | Status: AC

## 2023-09-08 NOTE — [facility] Notes (Signed)
 Results sent through MyChart

## 2023-09-09 ENCOUNTER — Other Ambulatory Visit: Payer: Self-pay | Admitting: Medical

## 2023-09-09 LAB — GC/CHLAMYDIA PROBE AMP
Chlamydia trachomatis, NAA: NEGATIVE
Neisseria Gonorrhoeae by PCR: NEGATIVE

## 2023-09-09 MED ORDER — ROSUVASTATIN CALCIUM 5 MG PO TABS: 5.0000 mg | ORAL_TABLET | ORAL | 1 refills | Status: AC

## 2023-11-30 ENCOUNTER — Ambulatory Visit (INDEPENDENT_AMBULATORY_CARE_PROVIDER_SITE_OTHER): Admitting: Medical

## 2023-11-30 ENCOUNTER — Encounter: Payer: Self-pay | Admitting: Medical

## 2023-11-30 VITALS — BP 120/80 | HR 75 | Wt 156.4 lb

## 2023-11-30 DIAGNOSIS — G4700 Insomnia, unspecified: Secondary | ICD-10-CM | POA: Diagnosis not present

## 2023-11-30 DIAGNOSIS — F430 Acute stress reaction: Secondary | ICD-10-CM | POA: Diagnosis not present

## 2023-11-30 MED ORDER — TEMAZEPAM 7.5 MG PO CAPS: 7.5000 mg | ORAL_CAPSULE | Freq: Every evening | ORAL | 0 refills | Status: AC | PRN

## 2023-11-30 MED ORDER — PROPRANOLOL HCL 40 MG PO TABS: 40.0000 mg | ORAL_TABLET | Freq: Every day | ORAL | 0 refills | Status: AC | PRN

## 2023-11-30 NOTE — Progress Notes (Signed)
 Subjective: Chief Complaint  Patient presents with   Consult    Stress and not sleeping, been going on 4-5 weeks. Ph-9 and GAD-7 is abnormal   History of Present Illness Charles Bartlett is a 48 year old male who presents with stress-related symptoms including headaches, sleep disturbances, and chest pain.  He has been experiencing significant stress primarily related to his work environment over the past few months. His job demands are continuous, with no ability to disconnect, even on weekends. Despite feeling overwhelmed and reaching a breaking point, he has been unable to take time off.  Time off requests were not approved by work  He has sleep disturbances, noting poor sleep quality for the past few months. Even when he sleeps, he dreams about work, leading to exhaustion upon waking. He has not used any sleep medications previously and denies seeing a Veterinary surgeon.  For the past few weeks, he has been waking up every morning with a headache, which he manages with ibuprofen or Advil. He also experiences acid reflux symptoms during the day, for which he takes Tums.  He reports episodes of facial flushing and redness of the ears, particularly during stressful situations at work, accompanied by a rapid heartbeat. These symptoms have become more frequent and noticeable, especially in group settings.  Last week, he experienced chest pain for the first time while at work, prompting him to seek medical evaluation. He had previous cardiac evaluations in 2019.  His current lifestyle includes a high intake of coffee and minimal food consumption during the day, as he often skips meals due to work demands, eating only after returning home in the evening. He switched jobs at the beginning of the year and has been in the current position for nine months. The first six months were manageable, but the workload has increased significantly in the past few months. He has not taken significant time off this  year.  No other aggravating or relieving factors. No other complaint.   Past Medical History:  Diagnosis Date   H/O echocardiogram 04/2017   mild LVH, Dr. Ladona; eval due to chest discomfort   Vitamin D  deficiency    Wears contact lenses    Current Outpatient Medications on File Prior to Visit  Medication Sig Dispense Refill   Creatine Monohydrate 1.5 GM/5ML LIQD      Cyanocobalamin (B-12) 1000 MCG CAPS      Multiple Vitamin (MULTIVITAMIN WITH MINERALS) TABS Take 1 tablet by mouth daily.     Na Sulfate-K Sulfate-Mg Sulf 17.5-3.13-1.6 GM/177ML SOLN See admin instructions.     Omega-3 Fatty Acids (SUPER OMEGA 3) 500 MG CAPS Take 1 capsule by mouth daily.     rosuvastatin  (CRESTOR ) 5 MG tablet Take 1 tablet (5 mg total) by mouth every other day. 90 tablet 1   sildenafil  (VIAGRA ) 100 MG tablet Take 1 tablet (100 mg total) by mouth as needed for erectile dysfunction. 6 tablet 11   Vitamin D , Ergocalciferol , (DRISDOL ) 1.25 MG (50000 UNIT) CAPS capsule Take 1 capsule (50,000 Units total) by mouth every 7 (seven) days. 12 capsule 2   No current facility-administered medications on file prior to visit.   ROS as in subjective    Objective: BP 120/80   Pulse 75   Wt 156 lb 6.4 oz (70.9 kg)   BMI 23.78 kg/m   Wt Readings from Last 3 Encounters:  11/30/23 156 lb 6.4 oz (70.9 kg)  09/07/23 160 lb 9.6 oz (72.8 kg)  10/17/22 161 lb (73 kg)  BP Readings from Last 3 Encounters:  11/30/23 120/80  09/07/23 124/72  10/17/22 (!) 97/53    Gen: wd, wn, nad Psych: somewhat stressed appearing    Assessment: Encounter Diagnoses  Name Primary?   Acute stress reaction Yes   Insomnia, unspecified type      Assessment and Plan Work-related stress and anxiety Stress and anxiety due to workload and lack of boundaries, with symptoms of flushing, red ears, and increased heart rate. - Encouraged communication with management for boundaries and workload expectations. - Recommended  counseling through employee assistance program -short term period off work to re-group and help with overwhelming stress -begin trial of Propranolol for stress reaction/situational anxiety  Insomnia Insomnia related to work stress, with difficulty sleeping and work-related dreams. - Prescribed temazepam as needed for sleep short term  Headache Chronic morning headaches potentially related to stress and lack of sleep. - Managed with ibuprofen.  Gastroesophageal reflux symptoms Symptoms exacerbated by stress and irregular eating patterns. - Managed with Tums.  Charles Bartlett was seen today for consult.  Diagnoses and all orders for this visit:  Acute stress reaction  Insomnia, unspecified type  Other orders -     propranolol (INDERAL) 40 MG tablet; Take 1 tablet (40 mg total) by mouth daily as needed. -     temazepam (RESTORIL) 7.5 MG capsule; Take 1 capsule (7.5 mg total) by mouth at bedtime as needed for sleep.    F/u with counseling, 63mo

## 2024-09-14 ENCOUNTER — Encounter: Payer: Self-pay | Admitting: Medical
